# Patient Record
Sex: Male | Born: 1985 | Race: Black or African American | Hispanic: No | Marital: Single | Smoking: Never smoker
Health system: Southern US, Community
[De-identification: ages and names within clinical notes are randomized; demographics above are authoritative.]

## PROBLEM LIST (undated history)

## (undated) DIAGNOSIS — E119 Type 2 diabetes mellitus without complications: Secondary | ICD-10-CM

## (undated) DIAGNOSIS — A4902 Methicillin resistant Staphylococcus aureus infection, unspecified site: Secondary | ICD-10-CM

## (undated) DIAGNOSIS — K859 Acute pancreatitis without necrosis or infection, unspecified: Secondary | ICD-10-CM

---

## 2010-06-24 ENCOUNTER — Emergency Department (HOSPITAL_BASED_OUTPATIENT_CLINIC_OR_DEPARTMENT_OTHER)
Admission: EM | Admit: 2010-06-24 | Discharge: 2010-06-24 | Disposition: A | Discharge status: Home / Self Care | Payer: Self-pay | Attending: Emergency Medicine | Admitting: Emergency Medicine

## 2010-06-24 DIAGNOSIS — R509 Fever, unspecified: Secondary | ICD-10-CM | POA: Insufficient documentation

## 2010-06-24 LAB — URINE MICROSCOPIC-ADD ON

## 2010-06-24 LAB — URINALYSIS, ROUTINE W REFLEX MICROSCOPIC
Glucose, UA: NEGATIVE mg/dL
Hgb urine dipstick: NEGATIVE
Ketones, ur: 15 mg/dL — AB
Protein, ur: 30 mg/dL — AB
Urobilinogen, UA: 0.2 mg/dL (ref 0.0–1.0)

## 2010-12-23 ENCOUNTER — Emergency Department (HOSPITAL_BASED_OUTPATIENT_CLINIC_OR_DEPARTMENT_OTHER)
Admission: EM | Admit: 2010-12-23 | Discharge: 2010-12-23 | Disposition: A | Discharge status: Home / Self Care | Payer: Self-pay | Attending: Emergency Medicine | Admitting: Emergency Medicine

## 2010-12-23 ENCOUNTER — Encounter: Payer: Self-pay | Admitting: *Deleted

## 2010-12-23 DIAGNOSIS — R197 Diarrhea, unspecified: Secondary | ICD-10-CM | POA: Insufficient documentation

## 2010-12-23 DIAGNOSIS — A088 Other specified intestinal infections: Secondary | ICD-10-CM | POA: Insufficient documentation

## 2010-12-23 DIAGNOSIS — A084 Viral intestinal infection, unspecified: Secondary | ICD-10-CM

## 2010-12-23 MED ORDER — DIPHENOXYLATE-ATROPINE 2.5-0.025 MG PO TABS: 1.0000 | ORAL_TABLET | Freq: Four times a day (QID) | ORAL | Status: AC | PRN

## 2010-12-23 NOTE — ED Notes (Signed)
Pt states he had diarrhea which started Wed "a little today", but basically here to make sure he is over it and can return to work.

## 2010-12-23 NOTE — ED Provider Notes (Signed)
History    Scribed for Felisa Bonier, MD, the patient was seen in room MH02/MH02. This chart was scribed by Katha Cabal.   CSN: [id number] Arrival date & time: 12/23/2010  7:41 PM   First MD Initiated Contact with Patient 12/23/10 2028      Chief Complaint  Patient presents with  . Diarrhea    (Consider location/radiation/quality/duration/timing/severity/associated sxs/prior treatment) Patient is a 25 y.o. male presenting with diarrhea. The history is provided by the patient. No language interpreter was used.  Diarrhea The primary symptoms include abdominal pain and diarrhea. Primary symptoms do not include fever or vomiting. The illness began 3 to 5 days ago. The onset was gradual. The problem has been gradually improving.  The abdominal pain is generalized.  The diarrhea is blood-tinged.   Patient states that he had a stomach virus about 5 days ago.  Reports improvement in diarrhea and abdominal pain.    History reviewed. No pertinent past medical history.  History reviewed. No pertinent past surgical history.  History reviewed. No pertinent family history.  History  Substance Use Topics  . Smoking status: Never Smoker   . Smokeless tobacco: Not on file  . Alcohol Use: No      Review of Systems  Constitutional: Negative for fever.  Gastrointestinal: Positive for abdominal pain and diarrhea. Negative for vomiting.  All other systems reviewed and are negative.    Allergies  Review of patient's allergies indicates no known allergies.  Home Medications  No current outpatient prescriptions on file.  BP 117/77  Pulse 71  Temp(Src) 97.9 F (36.6 C) (Oral)  Resp 18  Ht 5\' 5"  (1.651 m)  Wt 170 lb (77.111 kg)  BMI 28.29 kg/m2  SpO2 100%  Physical Exam  Constitutional: He is oriented to person, place, and time. He appears well-developed and well-nourished. No distress.  HENT:  Head: Normocephalic and atraumatic.  Mouth/Throat: Oropharynx is clear and  moist.  Eyes: Conjunctivae and EOM are normal.  Cardiovascular: Normal rate, regular rhythm and normal heart sounds.  Exam reveals no gallop and no friction rub.   No murmur heard. Pulmonary/Chest: Effort normal and breath sounds normal. No respiratory distress. He has no wheezes. He has no rales.  Abdominal: Soft. Bowel sounds are normal. There is no tenderness. There is no rebound and no guarding.  Neurological: He is alert and oriented to person, place, and time.  Skin: Skin is warm and dry.       Good capillary refill   Psychiatric: He has a normal mood and affect. His behavior is normal.    ED Course  Procedures (including critical care time)   DIAGNOSTIC STUDIES: Oxygen Saturation is 100% on room air, normal by my interpretation.     COORDINATION OF CARE: 9:17 PM   Physical exam complete.  Plan to discharge patient home.  Patient agrees with plan.      LABS / RADIOLOGY:   Labs Reviewed - No data to display No results found.       MDM   Resolving viral gastroenteritis.  I will treat remaining diarrhea with medication and allow patient to return to work.       MEDICATIONS GIVEN IN THE E.D. Scheduled Meds:   Continuous Infusions:       IMPRESSION: No diagnosis found.   DISCHARGE MEDICATIONS: New Prescriptions   No medications on file      I personally performed the services described in this documentation, which was scribed in my presence. The recorded  information has been reviewed and considered.             Felisa Bonier, MD 12/23/10 2127

## 2010-12-23 NOTE — ED Notes (Signed)
D/c home with rx x 1 for lomotil

## 2011-05-27 ENCOUNTER — Emergency Department (HOSPITAL_BASED_OUTPATIENT_CLINIC_OR_DEPARTMENT_OTHER)
Admission: EM | Admit: 2011-05-27 | Discharge: 2011-05-27 | Disposition: A | Discharge status: Home / Self Care | Payer: BC Managed Care – PPO | Attending: Emergency Medicine | Admitting: Emergency Medicine

## 2011-05-27 ENCOUNTER — Encounter (HOSPITAL_BASED_OUTPATIENT_CLINIC_OR_DEPARTMENT_OTHER): Payer: Self-pay | Admitting: *Deleted

## 2011-05-27 DIAGNOSIS — S239XXA Sprain of unspecified parts of thorax, initial encounter: Secondary | ICD-10-CM | POA: Insufficient documentation

## 2011-05-27 DIAGNOSIS — X58XXXA Exposure to other specified factors, initial encounter: Secondary | ICD-10-CM | POA: Insufficient documentation

## 2011-05-27 DIAGNOSIS — S29012A Strain of muscle and tendon of back wall of thorax, initial encounter: Secondary | ICD-10-CM

## 2011-05-27 MED ORDER — CYCLOBENZAPRINE HCL 10 MG PO TABS
10.0000 mg | ORAL_TABLET | Freq: Once | ORAL | Status: AC
  Administered 2011-05-27: 10 mg via ORAL
  Filled 2011-05-27: qty 1

## 2011-05-27 MED ORDER — TRAMADOL HCL 50 MG PO TABS
50.0000 mg | ORAL_TABLET | Freq: Once | ORAL | Status: AC
  Administered 2011-05-27: 50 mg via ORAL
  Filled 2011-05-27: qty 1

## 2011-05-27 MED ORDER — CYCLOBENZAPRINE HCL 10 MG PO TABS: 10.0000 mg | ORAL_TABLET | Freq: Three times a day (TID) | ORAL | Status: AC | PRN

## 2011-05-27 MED ORDER — TRAMADOL HCL 50 MG PO TABS: 50.0000 mg | ORAL_TABLET | Freq: Four times a day (QID) | ORAL | Status: AC | PRN

## 2011-05-27 MED ORDER — IBUPROFEN 800 MG PO TABS
800.0000 mg | ORAL_TABLET | Freq: Once | ORAL | Status: AC
  Administered 2011-05-27: 800 mg via ORAL
  Filled 2011-05-27: qty 1

## 2011-05-27 MED ORDER — NAPROXEN 500 MG PO TABS: 500.0000 mg | ORAL_TABLET | Freq: Two times a day (BID) | ORAL | Status: DC

## 2011-05-27 NOTE — ED Notes (Signed)
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

## 2011-05-27 NOTE — ED Provider Notes (Signed)
History    This chart was scribed for Dayton Bailiff, MD, MD by Smitty Pluck. The patient was seen in room MH03 and the patient's care was started at 8:34PM.   CSN: [id number]  Arrival date & time 05/27/11  2025   First MD Initiated Contact with Patient 05/27/11 2032      No chief complaint on file.   (Consider location/radiation/quality/duration/timing/severity/associated sxs/prior treatment) The history is provided by the patient.   Patrick Donovan is a 26 y.o. male who presents to the Emergency Department complaining of moderate back pain. Reports that he has had the pain for awhile but has tried to ignore it. 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 The pain has been constant since onset without radiation. He has not taken medication for pain PTA. Denies incontinence.  Denies any other pain.   History reviewed. No pertinent past medical history.  History reviewed. No pertinent past surgical history.  No family history on file.  History  Substance Use Topics  . Smoking status: Never Smoker   . Smokeless tobacco: Not on file  . Alcohol Use: No      Review of Systems  All other systems reviewed and are negative.   10 Systems reviewed and all are negative for acute change except as noted in the HPI.   Allergies  Review of patient's allergies indicates no known allergies.  Home Medications   Current Outpatient Rx  Name Route Sig Dispense Refill  . CYCLOBENZAPRINE HCL 10 MG PO TABS Oral Take 1 tablet (10 mg total) by mouth 3 (three) times daily as needed for muscle spasms. 30 tablet 0  . NAPROXEN 500 MG PO TABS Oral Take 1 tablet (500 mg total) by mouth 2 (two) times daily. 30 tablet 0  . TRAMADOL HCL 50 MG PO TABS Oral Take 1 tablet (50 mg total) by mouth every 6 (six) hours as needed for pain. 15 tablet 0    BP 131/98  Pulse 87  Temp(Src) 98 F (36.7 C) (Oral)  Resp 20  Wt 180 lb (81.647 kg)  SpO2 99%  Physical Exam  Nursing note  and vitals reviewed. Constitutional: He is oriented to person, place, and time. He appears well-developed and well-nourished. No distress.  HENT:  Head: Normocephalic and atraumatic.  Neck: Normal range of motion. Neck supple.  Cardiovascular: Normal rate, regular rhythm and normal heart sounds.   Pulmonary/Chest: Effort normal and breath sounds normal. No respiratory distress.  Abdominal: Soft. He exhibits no distension. There is no tenderness.  Musculoskeletal:       No pain in cervical spine to sacrum in the midline Pain in paraspinal and lateral musculature with palpation   Neurological: He is alert and oriented to person, place, and time. He has normal strength and normal reflexes. No cranial nerve deficit or sensory deficit.  Skin: Skin is warm and dry.  Psychiatric: He has a normal mood and affect. His behavior is normal.    ED Course  Procedures (including critical care time)  DIAGNOSTIC STUDIES: Oxygen Saturation is 99% on room air, normal by my interpretation.    COORDINATION OF CARE: 8:35PM EDP discusses pt ED treatment with pt.  8:38PM EDP orders medication: ibuprofen 800 mg, ultram 50 mg, flexeril 10 mg  Labs Reviewed - No data to display No results found.   1. Strain of mid-back       MDM  Consistent with a strain. No concern about a malignant cause of back  pain such as cauda equina or epidural abscess. No neurologic symptoms. No indication for imaging as he has no midline tenderness. His pain is strictly muscular. Instructed to refrain from driving while taking Ultram and Flexeril. Instructed to apply ice for 2 days and heat thereafter.  I personally performed the services described in this documentation, which was scribed in my presence. The recorded information has been reviewed and considered.        Dayton Bailiff, MD 05/27/11 2048

## 2011-05-27 NOTE — Discharge Instructions (Signed)
Back Pain, Adult Low back pain is very common. About 1 in 5 people have back pain.The cause of low back pain is rarely dangerous. The pain often gets better over time.About half of people with a sudden onset of back pain feel better in just 2 weeks. About 8 in 10 people feel better by 6 weeks.  CAUSES Some common causes of back pain include:  Strain of the muscles or ligaments supporting the spine.   Wear and tear (degeneration) of the spinal discs.   Arthritis.   Direct injury to the back.  DIAGNOSIS Most of the time, the direct cause of low back pain is not known.However, back pain can be treated effectively even when the exact cause of the pain is unknown.Answering your caregiver's questions about your overall health and symptoms is one of the most accurate ways to make sure the cause of your pain is not dangerous. If your caregiver needs more information, he or she may order lab work or imaging tests (X-rays or MRIs).However, even if imaging tests show changes in your back, this usually does not require surgery. HOME CARE INSTRUCTIONS For many people, back pain returns.Since low back pain is rarely dangerous, it is often a condition that people can learn to manageon their own.   Remain active. It is stressful on the back to sit or stand in one place. Do not sit, drive, or stand in one place for more than 30 minutes at a time. Take short walks on level surfaces as soon as pain allows.Try to increase the length of time you walk each day.   Do not stay in bed.Resting more than 1 or 2 days can delay your recovery.   Do not avoid exercise or work.Your body is made to move.It is not dangerous to be active, even though your back may hurt.Your back will likely heal faster if you return to being active before your pain is gone.   Pay attention to your body when you bend and lift. Many people have less discomfortwhen lifting if they bend their knees, keep the load close to their  bodies,and avoid twisting. Often, the most comfortable positions are those that put less stress on your recovering back.   Find a comfortable position to sleep. Use a firm mattress and lie on your side with your knees slightly bent. If you lie on your back, put a pillow under your knees.   Only take over-the-counter or prescription medicines as directed by your caregiver. Over-the-counter medicines to reduce pain and inflammation are often the most helpful.Your caregiver may prescribe muscle relaxant drugs.These medicines help dull your pain so you can more quickly return to your normal activities and healthy exercise.   Put ice on the injured area.   Put ice in a plastic bag.   Place a towel between your skin and the bag.   Leave the ice on for 15 to 20 minutes, 3 to 4 times a day for the first 2 to 3 days. After that, ice and heat may be alternated to reduce pain and spasms.   Ask your caregiver about trying back exercises and gentle massage. This may be of some benefit.   Avoid feeling anxious or stressed.Stress increases muscle tension and can worsen back pain.It is important to recognize when you are anxious or stressed and learn ways to manage it.Exercise is a great option.  SEEK MEDICAL CARE IF:  You have pain that is not relieved with rest or medicine.   You have   pain that does not improve in 1 week.   You have new symptoms.   You are generally not feeling well.  SEEK IMMEDIATE MEDICAL CARE IF:   You have pain that radiates from your back into your legs.   You develop new bowel or bladder control problems.   You have unusual weakness or numbness in your arms or legs.   You develop nausea or vomiting.   You develop abdominal pain.   You feel faint.  Document Released: 12/31/2004 Document Revised: 12/20/2010 Document Reviewed: 05/21/2010 [facility] Patient Information 2012 [place], [place].  Do not drive while taking Ultram and Flexeril

## 2012-04-09 ENCOUNTER — Emergency Department (HOSPITAL_BASED_OUTPATIENT_CLINIC_OR_DEPARTMENT_OTHER)
Admission: EM | Admit: 2012-04-09 | Discharge: 2012-04-09 | Disposition: A | Discharge status: Home / Self Care | Payer: BC Managed Care – PPO | Attending: Emergency Medicine | Admitting: Emergency Medicine

## 2012-04-09 ENCOUNTER — Emergency Department (HOSPITAL_BASED_OUTPATIENT_CLINIC_OR_DEPARTMENT_OTHER): Payer: BC Managed Care – PPO

## 2012-04-09 ENCOUNTER — Encounter (HOSPITAL_BASED_OUTPATIENT_CLINIC_OR_DEPARTMENT_OTHER): Payer: Self-pay

## 2012-04-09 DIAGNOSIS — N201 Calculus of ureter: Secondary | ICD-10-CM | POA: Insufficient documentation

## 2012-04-09 DIAGNOSIS — R1110 Vomiting, unspecified: Secondary | ICD-10-CM | POA: Insufficient documentation

## 2012-04-09 LAB — CBC WITH DIFFERENTIAL/PLATELET
Basophils Absolute: 0 10*3/uL (ref 0.0–0.1)
Eosinophils Relative: 0 % (ref 0–5)
HCT: 44.9 % (ref 39.0–52.0)
Hemoglobin: 15.7 g/dL (ref 13.0–17.0)
Lymphocytes Relative: 18 % (ref 12–46)
Lymphs Abs: 1.5 10*3/uL (ref 0.7–4.0)
MCV: 88.7 fL (ref 78.0–100.0)
Monocytes Absolute: 0.9 10*3/uL (ref 0.1–1.0)
Monocytes Relative: 10 % (ref 3–12)
Neutro Abs: 6.1 10*3/uL (ref 1.7–7.7)
WBC: 8.5 10*3/uL (ref 4.0–10.5)

## 2012-04-09 LAB — COMPREHENSIVE METABOLIC PANEL
AST: 25 U/L (ref 0–37)
BUN: 13 mg/dL (ref 6–23)
CO2: 28 mEq/L (ref 19–32)
Chloride: 97 mEq/L (ref 96–112)
Creatinine, Ser: 1.9 mg/dL — ABNORMAL HIGH (ref 0.50–1.35)
GFR calc Af Amer: 55 mL/min — ABNORMAL LOW (ref >90)
GFR calc non Af Amer: 48 mL/min — ABNORMAL LOW (ref >90)
Glucose, Bld: 124 mg/dL — ABNORMAL HIGH (ref 70–99)
Total Bilirubin: 1.5 mg/dL — ABNORMAL HIGH (ref 0.3–1.2)

## 2012-04-09 LAB — URINALYSIS, ROUTINE W REFLEX MICROSCOPIC
Glucose, UA: NEGATIVE mg/dL
Ketones, ur: 15 mg/dL — AB
Leukocytes, UA: NEGATIVE
Protein, ur: NEGATIVE mg/dL
Urobilinogen, UA: 0.2 mg/dL (ref 0.0–1.0)

## 2012-04-09 MED ORDER — IOHEXOL 300 MG/ML  SOLN
50.0000 mL | Freq: Once | INTRAMUSCULAR | Status: AC | PRN
  Administered 2012-04-09: 50 mL via ORAL

## 2012-04-09 MED ORDER — TAMSULOSIN HCL 0.4 MG PO CAPS: 0.4000 mg | ORAL_CAPSULE | Freq: Every day | ORAL | Status: DC

## 2012-04-09 MED ORDER — ONDANSETRON HCL 4 MG/2ML IJ SOLN
4.0000 mg | Freq: Once | INTRAMUSCULAR | Status: AC
Start: 2012-04-09 — End: 2012-04-09
  Administered 2012-04-09: 4 mg via INTRAVENOUS
  Filled 2012-04-09: qty 2

## 2012-04-09 MED ORDER — IBUPROFEN 800 MG PO TABS: 800.0000 mg | ORAL_TABLET | Freq: Three times a day (TID) | ORAL | Status: DC | PRN

## 2012-04-09 MED ORDER — SODIUM CHLORIDE 0.9 % IV BOLUS (SEPSIS)
1000.0000 mL | Freq: Once | INTRAVENOUS | Status: AC
  Administered 2012-04-09: 1000 mL via INTRAVENOUS

## 2012-04-09 MED ORDER — ONDANSETRON 8 MG PO TBDP
8.0000 mg | ORAL_TABLET | Freq: Once | ORAL | Status: AC
  Administered 2012-04-09: 8 mg via ORAL
  Filled 2012-04-09: qty 1

## 2012-04-09 MED ORDER — OXYCODONE-ACETAMINOPHEN 5-325 MG PO TABS: 1.0000 | ORAL_TABLET | Freq: Four times a day (QID) | ORAL | Status: DC | PRN

## 2012-04-09 MED ORDER — HYDROMORPHONE HCL PF 1 MG/ML IJ SOLN
1.0000 mg | Freq: Once | INTRAMUSCULAR | Status: AC
  Administered 2012-04-09: 1 mg via INTRAVENOUS
  Filled 2012-04-09: qty 1

## 2012-04-09 NOTE — ED Notes (Signed)
MD at bedside. 

## 2012-04-09 NOTE — ED Notes (Signed)
Patient vomiting. MD notified, Zofran prescribed and administered.

## 2012-04-09 NOTE — ED Notes (Signed)
Pt drinking PO contrast

## 2012-04-09 NOTE — ED Notes (Signed)
Pt reports onset of abdominal cramping that started Monday associated with vomiting x 2.

## 2012-04-09 NOTE — ED Provider Notes (Signed)
History     CSN: [id number]  Arrival date & time 04/09/12  1017   First MD Initiated Contact with Patient 04/09/12 1019      Chief Complaint  Patient presents with  . Abdominal Cramping  . Emesis    (Consider location/radiation/quality/duration/timing/severity/associated sxs/prior treatment) Patient is a 27 y.o. male presenting with cramps and vomiting.  Abdominal Cramping  Emesis  Pt reports 4 days of moderate cramping L lower abdominal pain, comes and goes, No particular provoking or relieving factors. Associated with vomiting x 2 over the last 4 days, last was this AM, subjective fever. No diarrhea or constipation. No dysuria. He has tried Aleve, pepto-bismal and TUMS without relief.   History reviewed. No pertinent past medical history.  History reviewed. No pertinent past surgical history.  No family history on file.  History  Substance Use Topics  . Smoking status: Never Smoker   . Smokeless tobacco: Not on file  . Alcohol Use: No      Review of Systems  Gastrointestinal: Positive for vomiting.   All other systems reviewed and are negative except as noted in HPI.   Allergies  Review of patient's allergies indicates no known allergies.  Home Medications  No current outpatient prescriptions on file.  BP 144/82  Pulse 83  Temp(Src) 98.9 F (37.2 C) (Oral)  Resp 16  Ht 5\' 5"  (1.651 m)  Wt 185 lb (83.915 kg)  BMI 30.79 kg/m2  SpO2 98%  Physical Exam  Nursing note and vitals reviewed. Constitutional: He is oriented to person, place, and time. He appears well-developed and well-nourished.  HENT:  Head: Normocephalic and atraumatic.  Eyes: EOM are normal. Pupils are equal, round, and reactive to light.  Neck: Normal range of motion. Neck supple.  Cardiovascular: Normal rate, normal heart sounds and intact distal pulses.   Pulmonary/Chest: Effort normal and breath sounds normal.  Abdominal: Bowel sounds are normal. He exhibits no distension. There is  no tenderness. There is no rebound and no guarding.  Musculoskeletal: Normal range of motion. He exhibits no edema and no tenderness.  Neurological: He is alert and oriented to person, place, and time. He has normal strength. No cranial nerve deficit or sensory deficit.  Skin: Skin is warm and dry. No rash noted.  Psychiatric: He has a normal mood and affect.    ED Course  Procedures (including critical care time)  Labs Reviewed  COMPREHENSIVE METABOLIC PANEL - Abnormal; Notable for the following:    Glucose, Bld 124 (*)    Creatinine, Ser 1.90 (*)    Total Bilirubin 1.5 (*)    GFR calc non Af Amer 48 (*)    GFR calc Af Amer 55 (*)    All other components within normal limits  URINALYSIS, ROUTINE W REFLEX MICROSCOPIC - Abnormal; Notable for the following:    Ketones, ur 15 (*)    All other components within normal limits  CBC WITH DIFFERENTIAL  LIPASE, BLOOD   Ct Abdomen Pelvis Wo Contrast  04/09/2012  *RADIOLOGY REPORT*  Clinical Data: Abdominal pain.  Vomiting.  CT ABDOMEN AND PELVIS WITHOUT CONTRAST  Technique:  Multidetector CT imaging of the abdomen and pelvis was performed following the standard protocol without intravenous contrast.  Comparison: None.  Findings: Mild dependent atelectasis is seen in the lung bases.  No pleural or pericardial effusion.  The patient has mild left hydronephrosis due to a proximal left ureteral stone measuring approximately 0.2 cm.  No other urinary tract stones are seen on the  right or the left.  Urinary bladder, prostate gland and seminal vesicles are unremarkable.  The liver is diffusely and markedly low attenuating consistent with fatty infiltration.  No focal liver lesion.  The gallbladder, spleen, adrenal glands and pancreas appear normal.  The stomach, small and large bowel and appendix appear normal.  There is no lymphadenopathy or fluid.  No focal bony abnormality is identified.  IMPRESSION:  1.  Mild left hydronephrosis due to a punctate  proximal left ureteral stone. 2.  Diffuse fatty infiltration of the liver.   Original Report Authenticated By: Holley Dexter, M.D.      No diagnosis found.    MDM  Abdomen benign, no focal tenderness, vitals unremarkable. Will check labs, hold off on imaging for now.   2:15 PM Labs reviewed, creatinine elevated but unknown baseline. Pain has returned and now tender in the LLQ. Will send for CT, PO contrast only.   2:41 PM CT reviewed, shows small proximal ureteral stone. Pt advised of the elevated creatinine and need for followup if symptoms persist. Pain meds, Flomax and Urology referral.     Bonnita Levan. Bernette Mayers, MD 04/09/12 [phone]

## 2013-08-10 ENCOUNTER — Emergency Department (HOSPITAL_BASED_OUTPATIENT_CLINIC_OR_DEPARTMENT_OTHER)
Admission: EM | Admit: 2013-08-10 | Discharge: 2013-08-11 | Disposition: A | Discharge status: Home / Self Care | Payer: No Typology Code available for payment source | Attending: Emergency Medicine | Admitting: Emergency Medicine

## 2013-08-10 ENCOUNTER — Encounter (HOSPITAL_BASED_OUTPATIENT_CLINIC_OR_DEPARTMENT_OTHER): Payer: Self-pay | Admitting: Emergency Medicine

## 2013-08-10 DIAGNOSIS — IMO0002 Reserved for concepts with insufficient information to code with codable children: Secondary | ICD-10-CM

## 2013-08-10 DIAGNOSIS — L02419 Cutaneous abscess of limb, unspecified: Secondary | ICD-10-CM | POA: Insufficient documentation

## 2013-08-10 DIAGNOSIS — Z79899 Other long term (current) drug therapy: Secondary | ICD-10-CM | POA: Insufficient documentation

## 2013-08-10 DIAGNOSIS — L03119 Cellulitis of unspecified part of limb: Principal | ICD-10-CM

## 2013-08-10 DIAGNOSIS — R21 Rash and other nonspecific skin eruption: Secondary | ICD-10-CM | POA: Insufficient documentation

## 2013-08-10 DIAGNOSIS — Z8614 Personal history of Methicillin resistant Staphylococcus aureus infection: Secondary | ICD-10-CM | POA: Insufficient documentation

## 2013-08-10 NOTE — ED Notes (Signed)
Pt reports ? Insect bite with redness and swelling to posterior lower thigh area.

## 2013-08-11 MED ORDER — SULFAMETHOXAZOLE-TMP DS 800-160 MG PO TABS: 1.0000 | ORAL_TABLET | Freq: Two times a day (BID) | ORAL | Status: DC

## 2013-08-11 NOTE — ED Provider Notes (Signed)
CSN: [id number]     Arrival date & time 08/10/13  2239 History   First MD Initiated Contact with Patient 08/10/13 2303     Chief Complaint  Patient presents with  . Insect Bite     (Consider location/radiation/quality/duration/timing/severity/associated sxs/prior Treatment) HPI Comments: 28 year-old male with history of MRSA abscess, no other significant echo history presents with swelling and redness to posterior left thigh. Similar previous abscess history. No fevers or systemic symptoms. Patient is nondiabetic and no current antibiotics  The history is provided by the patient.    History reviewed. No pertinent past medical history. History reviewed. No pertinent past surgical history. No family history on file. History  Substance Use Topics  . Smoking status: Never Smoker   . Smokeless tobacco: Not on file  . Alcohol Use: No    Review of Systems  Constitutional: Negative for fever and chills.  Respiratory: Negative for shortness of breath.   Cardiovascular: Negative for chest pain.  Gastrointestinal: Negative for vomiting and abdominal pain.  Genitourinary: Negative for dysuria and flank pain.  Musculoskeletal: Negative for neck pain and neck stiffness.  Skin: Positive for rash.  Neurological: Negative for light-headedness and headaches.      Allergies  Review of patient's allergies indicates no known allergies.  Home Medications   Prior to Admission medications   Medication Sig Start Date End Date Taking? Authorizing Provider  ibuprofen (ADVIL,MOTRIN) 800 MG tablet Take 1 tablet (800 mg total) by mouth every 8 (eight) hours as needed for pain. 04/09/12   Charles B. Bernette Mayers, MD  oxyCODONE-acetaminophen (PERCOCET/ROXICET) 5-325 MG per tablet Take 1-2 tablets by mouth every 6 (six) hours as needed for pain. 04/09/12   Charles B. Bernette Mayers, MD  sulfamethoxazole-trimethoprim (BACTRIM DS) 800-160 MG per tablet Take 1 tablet by mouth 2 (two) times daily. 08/11/13   Enid Skeens, MD  tamsulosin (FLOMAX) 0.4 MG CAPS Take 1 capsule (0.4 mg total) by mouth daily. 04/09/12   Charles B. Bernette Mayers, MD   BP 143/92  Pulse 94  Temp(Src) 97.9 F (36.6 C) (Oral)  Resp 16  Ht 5\' 5"  (1.651 m)  Wt 181 lb 6 oz (82.271 kg)  BMI 30.18 kg/m2  SpO2 100% Physical Exam  Nursing note and vitals reviewed. Constitutional: He is oriented to person, place, and time. He appears well-developed and well-nourished.  HENT:  Head: Normocephalic and atraumatic.  Eyes: Conjunctivae are normal. Right eye exhibits no discharge. Left eye exhibits no discharge.  Neck: Normal range of motion. Neck supple. No tracheal deviation present.  Cardiovascular: Normal rate.   Pulmonary/Chest: Effort normal.  Musculoskeletal: He exhibits no edema.  Neurological: He is alert and oriented to person, place, and time.  Skin: Skin is warm. Rash noted.  Patient has 2 cm diameter fluid collection/abscess left posterior thigh with mild warmth and erythema surrounding. No streaking redness.  Psychiatric: He has a normal mood and affect.    ED Course  Procedures (including critical care time) EMERGENCY DEPARTMENT US SOFT TISSUE INTERPRETATION "Study: Limited Soft Tissue Ultrasound"  INDICATIONS: Pain and Soft tissue infection Multiple views of the body part were obtained in real-time with a multi-frequency linear probe PERFORMED BY:  Myself IMAGES ARCHIVED?: Yes SIDE:Left BODY PART:Lower extremity FINDINGS: Abcess present INTERPRETATION:  Abcess present  INCISION AND DRAINAGE Performed by: Enid Skeens Consent: Verbal consent obtained. Risks and benefits: risks, benefits and alternatives were discussed Type: abscess  Body area: left thigh posterior Anesthesia: local infiltration Incision was made with a scalpel.  Local anesthetic: lidocaine Anesthetic total: 1 ml Complexity: simple Blunt dissection to break up loculations Drainage: 2 cc drainage  Patient tolerance: Patient tolerated  the procedure well with no immediate complications.    Labs Review Labs Reviewed - No data to display  Imaging Review No results found.   EKG Interpretation None      MDM   Final diagnoses:  Abscess or cellulitis of thigh    Patient with thigh abscess/ blister similar to previous. Discussed oral antibiotics without surrounding redness and followup outpatient. Supportive care discussed. I&D in ER. US in ED.   Results and differential diagnosis were discussed with the patient/parent/guardian. Close follow up outpatient was discussed, comfortable with the plan.   Medications - No data to display  Filed Vitals:   08/10/13 2248  BP: 143/92  Pulse: 94  Temp: 97.9 F (36.6 C)  TempSrc: Oral  Resp: 16  Height: 5\' 5"  (1.651 m)  Weight: 181 lb 6 oz (82.271 kg)  SpO2: 100%        Enid SkeensJoshua M Toluwani Ruder, MD 08/11/13 0131

## 2013-08-11 NOTE — Discharge Instructions (Signed)
If you were given medicines take as directed.  If you are on coumadin or contraceptives realize their levels and effectiveness is altered by many different medicines.  If you have any reaction (rash, tongues swelling, other) to the medicines stop taking and see a physician.   Wash body once or twice with chlorhexidine after abscess clears. Please follow up as directed and return to the ER or see a physician for new or worsening symptoms.  Thank you. Filed Vitals:   08/10/13 2248  BP: 143/92  Pulse: 94  Temp: 97.9 F (36.6 C)  TempSrc: Oral  Resp: 16  Height: 5\' 5"  (1.651 m)  Weight: 181 lb 6 oz (82.271 kg)  SpO2: 100%    Abscess An abscess is an infected area that contains a collection of pus and debris.It can occur in almost any part of the body. An abscess is also known as a furuncle or boil. CAUSES  An abscess occurs when tissue gets infected. This can occur from blockage of oil or sweat glands, infection of hair follicles, or a minor injury to the skin. As the body tries to fight the infection, pus collects in the area and creates pressure under the skin. This pressure causes pain. People with weakened immune systems have difficulty fighting infections and get certain abscesses more often.  SYMPTOMS Usually an abscess develops on the skin and becomes a painful mass that is red, warm, and tender. If the abscess forms under the skin, you may feel a moveable soft area under the skin. Some abscesses break open (rupture) on their own, but most will continue to get worse without care. The infection can spread deeper into the body and eventually into the bloodstream, causing you to feel ill.  DIAGNOSIS  Your caregiver will take your medical history and perform a physical exam. A sample of fluid may also be taken from the abscess to determine what is causing your infection. TREATMENT  Your caregiver may prescribe antibiotic medicines to fight the infection. However, taking antibiotics alone  usually does not cure an abscess. Your caregiver may need to make a small cut (incision) in the abscess to drain the pus. In some cases, gauze is packed into the abscess to reduce pain and to continue draining the area. HOME CARE INSTRUCTIONS   Only take over-the-counter or prescription medicines for pain, discomfort, or fever as directed by your caregiver.  If you were prescribed antibiotics, take them as directed. Finish them even if you start to feel better.  If gauze is used, follow your caregiver's directions for changing the gauze.  To avoid spreading the infection:  Keep your draining abscess covered with a bandage.  Wash your hands well.  Do not share personal care items, towels, or whirlpools with others.  Avoid skin contact with others.  Keep your skin and clothes clean around the abscess.  Keep all follow-up appointments as directed by your caregiver. SEEK MEDICAL CARE IF:   You have increased pain, swelling, redness, fluid drainage, or bleeding.  You have muscle aches, chills, or a general ill feeling.  You have a fever. MAKE SURE YOU:   Understand these instructions.  Will watch your condition.  Will get help right away if you are not doing well or get worse. Document Released: 10/10/2004 Document Revised: 07/02/2011 Document Reviewed: 03/15/2011 [facility] Patient Information 2015 [place]ExitCare, [place]. This information is not intended to replace advice given to you by your health care provider. Make sure you discuss any questions you have with  your health care provider. ° °

## 2013-08-13 ENCOUNTER — Emergency Department (HOSPITAL_BASED_OUTPATIENT_CLINIC_OR_DEPARTMENT_OTHER)
Admission: EM | Admit: 2013-08-13 | Discharge: 2013-08-13 | Disposition: A | Discharge status: Home / Self Care | Payer: No Typology Code available for payment source | Attending: Emergency Medicine | Admitting: Emergency Medicine

## 2013-08-13 ENCOUNTER — Encounter (HOSPITAL_BASED_OUTPATIENT_CLINIC_OR_DEPARTMENT_OTHER): Payer: Self-pay | Admitting: Emergency Medicine

## 2013-08-13 DIAGNOSIS — Z8614 Personal history of Methicillin resistant Staphylococcus aureus infection: Secondary | ICD-10-CM | POA: Insufficient documentation

## 2013-08-13 DIAGNOSIS — Z4801 Encounter for change or removal of surgical wound dressing: Secondary | ICD-10-CM | POA: Insufficient documentation

## 2013-08-13 DIAGNOSIS — Z791 Long term (current) use of non-steroidal anti-inflammatories (NSAID): Secondary | ICD-10-CM | POA: Insufficient documentation

## 2013-08-13 DIAGNOSIS — L03119 Cellulitis of unspecified part of limb: Principal | ICD-10-CM

## 2013-08-13 DIAGNOSIS — L02419 Cutaneous abscess of limb, unspecified: Secondary | ICD-10-CM | POA: Insufficient documentation

## 2013-08-13 DIAGNOSIS — Z79899 Other long term (current) drug therapy: Secondary | ICD-10-CM | POA: Insufficient documentation

## 2013-08-13 DIAGNOSIS — Z792 Long term (current) use of antibiotics: Secondary | ICD-10-CM | POA: Insufficient documentation

## 2013-08-13 HISTORY — DX: Methicillin resistant Staphylococcus aureus infection, unspecified site: A49.02

## 2013-08-13 MED ORDER — CLINDAMYCIN HCL 300 MG PO CAPS: 300.0000 mg | ORAL_CAPSULE | Freq: Three times a day (TID) | ORAL | Status: DC

## 2013-08-13 MED ORDER — OXYCODONE-ACETAMINOPHEN 5-325 MG PO TABS
1.0000 | ORAL_TABLET | Freq: Once | ORAL | Status: AC
  Administered 2013-08-13: 1 via ORAL
  Filled 2013-08-13: qty 1

## 2013-08-13 MED ORDER — OXYCODONE-ACETAMINOPHEN 5-325 MG PO TABS: 1.0000 | ORAL_TABLET | Freq: Four times a day (QID) | ORAL | Status: DC | PRN

## 2013-08-13 MED ORDER — CLINDAMYCIN HCL 150 MG PO CAPS
300.0000 mg | ORAL_CAPSULE | Freq: Once | ORAL | Status: AC
  Administered 2013-08-13: 300 mg via ORAL
  Filled 2013-08-13: qty 2

## 2013-08-13 NOTE — ED Provider Notes (Signed)
CSN: [id number]     Arrival date & time 08/13/13  2117 History  This chart was scribed for No att. providers found by Carl Best, ED Scribe. This patient was seen in room MHOTF/OTF and the patient's care was started at 10:23 PM.     Chief Complaint  Patient presents with  . Wound Check   Patient is a 28 y.o. male presenting with wound check. The history is provided by the patient. No language interpreter was used.  Wound Check   HPI Comments: Patrick Donovan is a 28 y.o. male who presents to the Emergency Department for wound care of an abscess that was drained 3 days ago.  He has been taking Bactrim as prescribed.  He states that the area has become darker around the I&D site and is still tender.  He denies vomiting and fever as associated symptoms.  He does not have any allergies to medication.  He does not have a PCP.  States pain is constant and that he has not been taking anything for pain.  There are no other associated systemic symptoms, there are no other alleviating or modifying factors.    Past Medical History  Diagnosis Date  . MRSA infection    History reviewed. No pertinent past surgical history. History reviewed. No pertinent family history. History  Substance Use Topics  . Smoking status: Never Smoker   . Smokeless tobacco: Not on file  . Alcohol Use: No    Review of Systems  Skin: Positive for wound.  All other systems reviewed and are negative.   Allergies  Review of patient's allergies indicates no known allergies.  Home Medications   Prior to Admission medications   Medication Sig Start Date End Date Taking? Authorizing Provider  clindamycin (CLEOCIN) 300 MG capsule Take 1 capsule (300 mg total) by mouth 3 (three) times daily. 08/13/13   Ethelda Chick, MD  ibuprofen (ADVIL,MOTRIN) 800 MG tablet Take 1 tablet (800 mg total) by mouth every 8 (eight) hours as needed for pain. 04/09/12   Charles B. Bernette Mayers, MD  oxyCODONE-acetaminophen (PERCOCET/ROXICET) 5-325  MG per tablet Take 1-2 tablets by mouth every 6 (six) hours as needed for pain. 04/09/12   Charles B. Bernette Mayers, MD  oxyCODONE-acetaminophen (PERCOCET/ROXICET) 5-325 MG per tablet Take 1-2 tablets by mouth every 6 (six) hours as needed for severe pain. 08/13/13   Ethelda Chick, MD  sulfamethoxazole-trimethoprim (BACTRIM DS) 800-160 MG per tablet Take 1 tablet by mouth 2 (two) times daily. 08/11/13   Enid Skeens, MD  tamsulosin (FLOMAX) 0.4 MG CAPS Take 1 capsule (0.4 mg total) by mouth daily. 04/09/12   Charles B. Bernette Mayers, MD   Triage Vitals: BP 129/83  Pulse 77  Temp(Src) 98.4 F (36.9 C) (Oral)  Resp 16  Ht 5\' 5"  (1.651 m)  Wt 181 lb (82.101 kg)  BMI 30.12 kg/m2  SpO2 98%  Physical Exam  Nursing note and vitals reviewed. Constitutional: He is oriented to person, place, and time. He appears well-developed and well-nourished.  HENT:  Head: Normocephalic and atraumatic.  Eyes: EOM are normal.  Neck: Normal range of motion.  Cardiovascular: Normal rate.   Pulmonary/Chest: Effort normal.  Musculoskeletal: Normal range of motion.  Neurological: He is alert and oriented to person, place, and time.  Skin: Skin is warm and dry. There is erythema.  Left posterior thigh has an area about 2 cm in diameter of erythema and induration with about 1 cm central ulcerated area.  No fluctuance.  Erythema  does not extend past the borders marked at prior visit.  No active pus drainage.    Psychiatric: He has a normal mood and affect. His behavior is normal.    ED Course  Procedures (including critical care time)  DIAGNOSTIC STUDIES: Oxygen Saturation is 98% on room air, normal by my interpretation.    COORDINATION OF CARE: 10:24 PM- Discussed changing the patient's bandages and starting the patient on Clindamycin and pain medciation.  Advised the patient to recheck the area in 48 hours.  The patient agreed to the treatment plan.     Labs Review Labs Reviewed - No data to display  Imaging  Review No results found.   EKG Interpretation None      MDM   Final diagnoses:  Cellulitis and abscess of lower extremity    Pt presenting with c/o continued pain and redness around the site of an abscess on his posterior thigh after I and D.  No prurulent drainage.  Area of cellulitis has not gone outside the borders marked at prior visit.  Pt given rx for pain medication, will switch to clindamycin for continued cellultiis.  No area of fluctuance so I do not believe repeat I and D will help.  Pt advised to return in 48 hours for recheck.  Discharged with strict return precautions.  Pt agreeable with plan.  I personally performed the services described in this documentation, which was scribed in my presence. The recorded information has been reviewed and is accurate.    Ethelda ChickMartha K Linker, MD 08/14/13 [phone]

## 2013-08-13 NOTE — ED Notes (Signed)
Pt reports having an abscess to left posterior thigh drained on Tuesday at this facility - here for a wound check, admits to continued drainage from site and slight increase in pain and swelling - denies fever.

## 2013-08-13 NOTE — Discharge Instructions (Signed)
Return to the ED with any concerns including fever/chills, increased area of redness, vomiting and not able to keep down liquids or antibiotics, decreased level of alertness/lethargy, or any other alarming symptoms °

## 2013-08-13 NOTE — ED Notes (Signed)
Here for recheck of abscess of left thigh  Red and swollen

## 2014-03-19 ENCOUNTER — Encounter (HOSPITAL_BASED_OUTPATIENT_CLINIC_OR_DEPARTMENT_OTHER): Payer: Self-pay | Admitting: *Deleted

## 2014-03-19 ENCOUNTER — Emergency Department (HOSPITAL_BASED_OUTPATIENT_CLINIC_OR_DEPARTMENT_OTHER)
Admission: EM | Admit: 2014-03-19 | Discharge: 2014-03-19 | Disposition: A | Discharge status: Home / Self Care | Payer: Self-pay | Attending: Emergency Medicine | Admitting: Emergency Medicine

## 2014-03-19 DIAGNOSIS — H00016 Hordeolum externum left eye, unspecified eyelid: Secondary | ICD-10-CM

## 2014-03-19 DIAGNOSIS — H00024 Hordeolum internum left upper eyelid: Secondary | ICD-10-CM | POA: Insufficient documentation

## 2014-03-19 DIAGNOSIS — Z79899 Other long term (current) drug therapy: Secondary | ICD-10-CM | POA: Insufficient documentation

## 2014-03-19 DIAGNOSIS — Z792 Long term (current) use of antibiotics: Secondary | ICD-10-CM | POA: Insufficient documentation

## 2014-03-19 DIAGNOSIS — Z8614 Personal history of Methicillin resistant Staphylococcus aureus infection: Secondary | ICD-10-CM | POA: Insufficient documentation

## 2014-03-19 MED ORDER — ERYTHROMYCIN 5 MG/GM OP OINT: TOPICAL_OINTMENT | OPHTHALMIC | Status: DC

## 2014-03-19 NOTE — ED Provider Notes (Signed)
CSN: [id number]     Arrival date & time 03/19/14  1223 History   First MD Initiated Contact with Patient 03/19/14 1320     Chief Complaint  Patient presents with  . Facial Swelling     (Consider location/radiation/quality/duration/timing/severity/associated sxs/prior Treatment) Patient is a 29 y.o. male presenting with eye problem.  Eye Problem Location:  L eye Quality:  Stinging Severity:  Moderate Onset quality:  Gradual Duration:  2 days Timing:  Constant Progression:  Worsening Chronicity:  New Context comment:  Atraumatic Relieved by:  Nothing Worsened by:  Nothing tried Ineffective treatments:  None tried Associated symptoms: swelling and tearing   Associated symptoms: no blurred vision, no decreased vision, no discharge, no itching, no nausea, no tingling and no vomiting     Past Medical History  Diagnosis Date  . MRSA infection    History reviewed. No pertinent past surgical history. No family history on file. History  Substance Use Topics  . Smoking status: Never Smoker   . Smokeless tobacco: Not on file  . Alcohol Use: No    Review of Systems  Eyes: Negative for blurred vision, discharge and itching.  Gastrointestinal: Negative for nausea and vomiting.  Neurological: Negative for tingling.  All other systems reviewed and are negative.     Allergies  Review of patient's allergies indicates no known allergies.  Home Medications   Prior to Admission medications   Medication Sig Start Date End Date Taking? Authorizing Provider  clindamycin (CLEOCIN) 300 MG capsule Take 1 capsule (300 mg total) by mouth 3 (three) times daily. 08/13/13   Ethelda Chick, MD  erythromycin ophthalmic ointment Place a 1/2 inch ribbon of ointment into the lower eyelid of L eye BID x 7 days. 03/19/14   Mirian Mo, MD  ibuprofen (ADVIL,MOTRIN) 800 MG tablet Take 1 tablet (800 mg total) by mouth every 8 (eight) hours as needed for pain. 04/09/12   Charles B. Bernette Mayers, MD   oxyCODONE-acetaminophen (PERCOCET/ROXICET) 5-325 MG per tablet Take 1-2 tablets by mouth every 6 (six) hours as needed for pain. 04/09/12   Charles B. Bernette Mayers, MD  oxyCODONE-acetaminophen (PERCOCET/ROXICET) 5-325 MG per tablet Take 1-2 tablets by mouth every 6 (six) hours as needed for severe pain. 08/13/13   Ethelda Chick, MD  sulfamethoxazole-trimethoprim (BACTRIM DS) 800-160 MG per tablet Take 1 tablet by mouth 2 (two) times daily. 08/11/13   Enid Skeens, MD  tamsulosin (FLOMAX) 0.4 MG CAPS Take 1 capsule (0.4 mg total) by mouth daily. 04/09/12   Charles B. Bernette Mayers, MD   BP 119/80 mmHg  Pulse 82  Temp(Src) 98.4 F (36.9 C) (Oral)  Resp 14  Ht  (1.651 m)  Wt 178 lb (80.74 kg)  BMI 29.62 kg/m2  SpO2 100% Physical Exam  Constitutional: He is oriented to person, place, and time. He appears well-developed and well-nourished.  HENT:  Head: Normocephalic and atraumatic.  Eyes: EOM are normal. Pupils are equal, round, and reactive to light. Right conjunctiva is injected. Left conjunctiva is injected.  Swelling of L superior eyelid, blepharitis vs early stye  Neck: Normal range of motion. Neck supple.  Cardiovascular: Normal rate, regular rhythm and normal heart sounds.   Pulmonary/Chest: Effort normal and breath sounds normal. No respiratory distress.  Abdominal: He exhibits no distension. There is no tenderness. There is no rebound and no guarding.  Musculoskeletal: Normal range of motion.  Neurological: He is alert and oriented to person, place, and time.  Skin: Skin is warm and dry.  Vitals reviewed.   ED Course  Procedures (including critical care time) Labs Review Labs Reviewed - No data to display  Imaging Review No results found.   EKG Interpretation None      MDM   Final diagnoses:  Stye, left    29 y.o. male without pertinent PMH presents with L eyelid swelling.  No visual complaint, states he occasionally sees the swelling, but no frank visual change.   Physical exam with blepharitis vs stye.  Standard return precautions and erythromycin given.  DC home in stable condition.    I have reviewed all laboratory and imaging studies if ordered as above  1. Stye, left         Mirian MoMatthew Gentry, MD 03/19/14 [phone]

## 2014-03-19 NOTE — Discharge Instructions (Signed)
Sty °A sty (hordeolum) is an infection of a gland in the eyelid located at the base of the eyelash. A sty may develop a white or yellow head of pus. It can be puffy (swollen). Usually, the sty will burst and pus will come out on its own. They do not leave lumps in the eyelid once they drain. °A sty is often confused with another form of cyst of the eyelid called a chalazion. Chalazions occur within the eyelid and not on the edge where the bases of the eyelashes are. They often are red, sore and then form firm lumps in the eyelid. °CAUSES  °· Germs (bacteria). °· Lasting (chronic) eyelid inflammation. °SYMPTOMS  °· Tenderness, redness and swelling along the edge of the eyelid at the base of the eyelashes. °· Sometimes, there is a white or yellow head of pus. It may or may not drain. °DIAGNOSIS  °An ophthalmologist will be able to distinguish between a sty and a chalazion and treat the condition appropriately.  °TREATMENT  °· Styes are typically treated with warm packs (compresses) until drainage occurs. °· In rare cases, medicines that kill germs (antibiotics) may be prescribed. These antibiotics may be in the form of drops, cream or pills. °· If a hard lump has formed, it is generally necessary to do a small incision and remove the hardened contents of the cyst in a minor surgical procedure done in the office. °· In suspicious cases, your caregiver may send the contents of the cyst to the lab to be certain that it is not a rare, but dangerous form of cancer of the glands of the eyelid. °HOME CARE INSTRUCTIONS  °· Wash your hands often and dry them with a clean towel. Avoid touching your eyelid. This may spread the infection to other parts of the eye. °· Apply heat to your eyelid for 10 to 20 minutes, several times a day, to ease pain and help to heal it faster. °· Do not squeeze the sty. Allow it to drain on its own. Wash your eyelid carefully 3 to 4 times per day to remove any pus. °SEEK IMMEDIATE MEDICAL CARE IF:    °· Your eye becomes painful or puffy (swollen). °· Your vision changes. °· Your sty does not drain by itself within 3 days. °· Your sty comes back within a short period of time, even with treatment. °· You have redness (inflammation) around the eye. °· You have a fever. °Document Released: 10/10/2004 Document Revised: 03/25/2011 Document Reviewed: 04/16/2013 °ExitCare® Patient Information ©2015 [employer]. This information is not intended to replace advice given to you by your health care provider. Make sure you discuss any questions you have with your health care provider. ° °Blepharitis °Blepharitis is redness, soreness, and swelling (inflammation) of one or both eyelids. It may be caused by an allergic reaction or a bacterial infection. Blepharitis may also be associated with reddened, scaly skin (seborrhea) of the scalp and eyebrows. While you sleep, eye discharge may cause your eyelashes to stick together. Your eyelids may itch, burn, swell, and may lose their lashes. These will grow back. Your eyes may become sensitive. Blepharitis may recur and need repeated treatment. If this is the case, you may require further evaluation by an eye specialist (ophthalmologist). °HOME CARE INSTRUCTIONS  °· Keep your hands clean. °· Use a clean towel each time you dry your eyelids. Do not use this towel to clean other areas. Do not share a towel or makeup with anyone. °· Wash your   eyelids with warm water or warm water mixed with a small amount of baby shampoo. Do this twice a day or as often as needed. °· Wash your face and eyebrows at least once a day. °· Use warm compresses 2 times a day for 10 minutes at a time, or as directed by your caregiver. °· Apply antibiotic ointment as directed by your caregiver. °· Avoid rubbing your eyes. °· Avoid wearing makeup until you get better. °· Follow up with your caregiver as directed. °SEEK IMMEDIATE MEDICAL CARE IF:  °· You have pain, redness, or swelling that gets worse or  spreads to other parts of your face. °· Your vision changes, or you have pain when looking at lights or moving objects. °· You have a fever. °· Your symptoms continue for longer than 2 to 4 days or become worse. °MAKE SURE YOU:  °· Understand these instructions. °· Will watch your condition. °· Will get help right away if you are not doing well or get worse. °Document Released: 12/29/1999 Document Revised: 03/25/2011 Document Reviewed: 02/07/2010 °ExitCare® Patient Information ©2015 [employer]. This information is not intended to replace advice given to you by your health care provider. Make sure you discuss any questions you have with your health care provider. ° °

## 2014-03-19 NOTE — ED Notes (Signed)
C/o left eye swelling since yesterday. No redness noted. Has noticed on and off blurriness in that eye. No injury.

## 2014-03-23 ENCOUNTER — Emergency Department (HOSPITAL_BASED_OUTPATIENT_CLINIC_OR_DEPARTMENT_OTHER)
Admission: EM | Admit: 2014-03-23 | Discharge: 2014-03-23 | Disposition: A | Discharge status: Home / Self Care | Payer: Self-pay | Attending: Emergency Medicine | Admitting: Emergency Medicine

## 2014-03-23 ENCOUNTER — Encounter (HOSPITAL_BASED_OUTPATIENT_CLINIC_OR_DEPARTMENT_OTHER): Payer: Self-pay | Admitting: *Deleted

## 2014-03-23 DIAGNOSIS — Z8614 Personal history of Methicillin resistant Staphylococcus aureus infection: Secondary | ICD-10-CM | POA: Insufficient documentation

## 2014-03-23 DIAGNOSIS — Z79899 Other long term (current) drug therapy: Secondary | ICD-10-CM | POA: Insufficient documentation

## 2014-03-23 DIAGNOSIS — Z792 Long term (current) use of antibiotics: Secondary | ICD-10-CM | POA: Insufficient documentation

## 2014-03-23 DIAGNOSIS — H00016 Hordeolum externum left eye, unspecified eyelid: Secondary | ICD-10-CM

## 2014-03-23 DIAGNOSIS — H00014 Hordeolum externum left upper eyelid: Secondary | ICD-10-CM | POA: Insufficient documentation

## 2014-03-23 NOTE — ED Notes (Signed)
Pt has a swollen left eyelid. He was seen here 4 days ago and prescribed erythromycin ointment. Pt sts he has been using the ointment as directed but the stye is not improving.

## 2014-03-23 NOTE — ED Provider Notes (Signed)
CSN: [id number]     Arrival date & time 03/23/14  1254 History   First MD Initiated Contact with Patient 03/23/14 1446     Chief Complaint  Patient presents with  . Eye Problem     (Consider location/radiation/quality/duration/timing/severity/associated sxs/prior Treatment) HPI   Patrick Donovan is a 29 y.o. male complaining of persistent stye to left upper eyelid. Patient was seen here 4 days ago was started on erythromycin ointment which she has been using appropriately. He's been using warm compresses proximally 7 times per day. He denies any change in his vision, pain with eye movement, fever, chills. He states that the size is improving however it still persists. Patient has had to have styes lanced in the past. He does not have a ophthalmologist that he follows with.  Past Medical History  Diagnosis Date  . MRSA infection    History reviewed. No pertinent past surgical history. No family history on file. History  Substance Use Topics  . Smoking status: Never Smoker   . Smokeless tobacco: Not on file  . Alcohol Use: No    Review of Systems  10 systems reviewed and found to be negative, except as noted in the HPI.   Allergies  Review of patient's allergies indicates no known allergies.  Home Medications   Prior to Admission medications   Medication Sig Start Date End Date Taking? Authorizing Provider  clindamycin (CLEOCIN) 300 MG capsule Take 1 capsule (300 mg total) by mouth 3 (three) times daily. 08/13/13   Jerelyn Scott, MD  erythromycin ophthalmic ointment Place a 1/2 inch ribbon of ointment into the lower eyelid of L eye BID x 7 days. 03/19/14   Noel Gerold, MD  ibuprofen (ADVIL,MOTRIN) 800 MG tablet Take 1 tablet (800 mg total) by mouth every 8 (eight) hours as needed for pain. 04/09/12   Susy Frizzle, MD  oxyCODONE-acetaminophen (PERCOCET/ROXICET) 5-325 MG per tablet Take 1-2 tablets by mouth every 6 (six) hours as needed for pain. 04/09/12   Susy Frizzle, MD    oxyCODONE-acetaminophen (PERCOCET/ROXICET) 5-325 MG per tablet Take 1-2 tablets by mouth every 6 (six) hours as needed for severe pain. 08/13/13   Jerelyn Scott, MD  sulfamethoxazole-trimethoprim (BACTRIM DS) 800-160 MG per tablet Take 1 tablet by mouth 2 (two) times daily. 08/11/13   Blane Ohara, MD  tamsulosin (FLOMAX) 0.4 MG CAPS Take 1 capsule (0.4 mg total) by mouth daily. 04/09/12   Susy Frizzle, MD   BP 140/89 mmHg  Pulse 90  Temp(Src) 98.4 F (36.9 C) (Oral)  Resp 16  Wt 178 lb (80.74 kg)  SpO2 99% Physical Exam  Constitutional: He is oriented to person, place, and time. He appears well-developed and well-nourished. No distress.  HENT:  Head: Normocephalic and atraumatic.  Mouth/Throat: Oropharynx is clear and moist.  Eyes: Conjunctivae and EOM are normal. Pupils are equal, round, and reactive to light. Left eye exhibits hordeolum.    Neck: Normal range of motion.  Cardiovascular: Normal rate, regular rhythm and intact distal pulses.   Pulmonary/Chest: Effort normal and breath sounds normal. No stridor. No respiratory distress. He has no wheezes. He has no rales. He exhibits no tenderness.  Abdominal: Soft. Bowel sounds are normal. He exhibits no distension and no mass. There is no tenderness. There is no rebound and no guarding.  Musculoskeletal: Normal range of motion.  Neurological: He is alert and oriented to person, place, and time.  Psychiatric: He has a normal mood and affect.  Nursing note and vitals reviewed.  ED Course  Procedures (including critical care time) Labs Review Labs Reviewed - No data to display  Imaging Review No results found.   EKG Interpretation None      MDM   Final diagnoses:  Hordeolum external, left    Filed Vitals:   03/23/14 1300 03/23/14 1533  BP: 140/89 131/85  Pulse: 90 65  Temp: 98.4 F (36.9 C)   TempSrc: Oral   Resp: 16 16  Weight: 178 lb (80.74 kg)   SpO2: 99% 100%    Patrick Donovan is a pleasant 29 y.o.  male presenting with Asst. Patrick Donovan to left upper lid. He states that this is improving with erythromycin and warm compresses will be given an ophthalmology referral for possible incision and drainage.  Evaluation does not show pathology that would require ongoing emergent intervention or inpatient treatment. Pt is hemodynamically stable and mentating appropriately. Discussed findings and plan with patient/guardian, who agrees with care plan. All questions answered. Return precautions discussed and outpatient follow up given.     Wynetta Emeryicole Niaja Stickley, PA-C 03/23/14 1727  Purvis SheffieldForrest Harrison, MD 03/24/14 [id number]

## 2014-03-23 NOTE — Discharge Instructions (Signed)
Do not hesitate to return to the emergency room for any new, worsening or concerning symptoms.  Please obtain primary care using resource guide below. But the minute you were seen in the emergency room and that they will need to obtain records for further outpatient management.   Sty A sty (hordeolum) is an infection of a gland in the eyelid located at the base of the eyelash. A sty may develop a white or yellow head of pus. It can be puffy (swollen). Usually, the sty will burst and pus will come out on its own. They do not leave lumps in the eyelid once they drain. A sty is often confused with another form of cyst of the eyelid called a chalazion. Chalazions occur within the eyelid and not on the edge where the bases of the eyelashes are. They often are red, sore and then form firm lumps in the eyelid. CAUSES   Germs (bacteria).  Lasting (chronic) eyelid inflammation. SYMPTOMS   Tenderness, redness and swelling along the edge of the eyelid at the base of the eyelashes.  Sometimes, there is a white or yellow head of pus. It may or may not drain. DIAGNOSIS  An ophthalmologist will be able to distinguish between a sty and a chalazion and treat the condition appropriately.  TREATMENT   Styes are typically treated with warm packs (compresses) until drainage occurs.  In rare cases, medicines that kill germs (antibiotics) may be prescribed. These antibiotics may be in the form of drops, cream or pills.  If a hard lump has formed, it is generally necessary to do a small incision and remove the hardened contents of the cyst in a minor surgical procedure done in the office.  In suspicious cases, your caregiver may send the contents of the cyst to the lab to be certain that it is not a rare, but dangerous form of cancer of the glands of the eyelid. HOME CARE INSTRUCTIONS   Wash your hands often and dry them with a clean towel. Avoid touching your eyelid. This may spread the infection to other  parts of the eye.  Apply heat to your eyelid for 10 to 20 minutes, several times a day, to ease pain and help to heal it faster.  Do not squeeze the sty. Allow it to drain on its own. Wash your eyelid carefully 3 to 4 times per day to remove any pus. SEEK IMMEDIATE MEDICAL CARE IF:   Your eye becomes painful or puffy (swollen).  Your vision changes.  Your sty does not drain by itself within 3 days.  Your sty comes back within a short period of time, even with treatment.  You have redness (inflammation) around the eye.  You have a fever. Document Released: 10/10/2004 Document Revised: 03/25/2011 Document Reviewed: 04/16/2013 [facility] Patient Information 2015 [place], [place]. This information is not intended to replace advice given to you by your health care provider. Make sure you discuss any questions you have with your health care provider.   Emergency Department Resource Guide 1) Find a Doctor and Pay Out of Pocket Although you won't have to find out who is covered by your insurance plan, it is a good idea to ask around and get recommendations. You will then need to call the office and see if the doctor you have chosen will accept you as a new patient and what types of options they offer for patients who are self-pay. Some doctors offer discounts or will set up payment plans for their patients  who do not have insurance, but you will need to ask so you aren't surprised when you get to your appointment.  2) Contact Your Local Health Department Not all health departments have doctors that can see patients for sick visits, but many do, so it is worth a call to see if yours does. If you don't know where your local health department is, you can check in your phone book. The CDC also has a tool to help you locate your state's health department, and many state websites also have listings of all of their local health departments.  3) Find a Walk-in Clinic If your illness is not likely to be  very severe or complicated, you may want to try a walk in clinic. These are popping up all over the country in pharmacies, drugstores, and shopping centers. They're usually staffed by nurse practitioners or physician assistants that have been trained to treat common illnesses and complaints. They're usually fairly quick and inexpensive. However, if you have serious medical issues or chronic medical problems, these are probably not your best option.  No Primary Care Doctor: - Call Health Connect at  [phone] - they can help you locate a primary care doctor that  accepts your insurance, provides certain services, etc. - Physician Referral Service- [phone]  Chronic Pain Problems: Organization         Address  Phone   Notes  [facility]  [phone] Patients need to be referred by their primary care doctor.   Medication Assistance: Organization         Address  Phone   Notes  [facility] [address] [place], [place] [id number] [phone] --Must be a resident of [place] -- Must have NO insurance coverage whatsoever (no Medicaid/ Medicare, etc.) -- The pt. MUST have a primary care doctor that directs their care regularly and follows them in the community   Med[facility]ist  [phone]   [employer]  [phone]    Agencies that provide inexpensive medical care: Organization         Address  Phone   Notes  [facility]  [phone]   [facility]    [phone]   [facility] [address] [place], [place] [address] [phone]) [phone]   [facility] [address], [place] [phone]   [facility]anned Parenthood    [phone]   [facility]    [phone]) [phone]   [facility]  [address], [place] Phone:  [phone], Fax:  [phone] Hours of Operation:  9 am - 6 pm, M-F.  Also  accepts Medicaid/Medicare and self-pay.  [facility]  [address], [place] Phone: [phone], Fax: [phone]. Hours of Operation:  8:30 am - 5:30 pm, M-F.  Also accepts Medicaid and self-pay.  [facility] [place] [address], [place] Phone: [phone]   [facility] [address], [place], [place] [phone], Ext. [phone] Mondays & Thursdays: 7-9 AM.  First 15 patients are seen on a first come, first serve basis.    Medicaid-accepting [facility] Providers:  Organization         Address  Phone   Notes  [facility] [address], [place] [phone] Also accepts self-pay patients.  [facility] [address] Laurell Josephs [place], [place]  [phone]   [facility] [address], [place] [phone]   [facility] [address], [place] [phone]   Renaye Rakers [address], [place]   [phone] Only accepts [place] Access [place] patients after they have their name applied to their card.   Self-Pay (no insurance) in [facility]:  Organization         Address  Phone   Notes  Sickle Cell Patients, [facility] [address] [place], [place] [phone]   [facility] [address] [place], [place] [phone]   [facility]  [address], [place] [phone]   [facility]/Dr. Osei-Bonsu  [address] [place] or [address], [place] [phone] Phone number for both [place] and [place] locations is the same.  [facility] [address] [place] [phone]   [facility] [address], [place] or [address] [phone]   [facility] [address],  [place] [phone], phone; [phone], fax Sees patients 1st and 3rd Saturday of every month.  Must not qualify for public or private insurance (i.e. Medicaid, Medicare, [place] Health Choice, Veterans' Benefits)  Household income should be no more than 200% of the poverty level The clinic cannot treat you if you are pregnant or think you are pregnant  Sexually transmitted diseases are not treated at the clinic.    Dental Care: Organization         Address  Phone  Notes  [facility] Department of [facility] [address] [place], [place] [phone] Accepts children up to age 100 who are enrolled in [place] or [place] Health Choice; pregnant women with a Medicaid card; and children who have applied for Medicaid or [place] Health Choice, but were declined, whose parents can pay a reduced fee at time of service.  [facility] of [facility]  [address], [place] [phone] Accepts children up to age 40 who are enrolled in [place] or [place] Health Choice; pregnant women with a Medicaid card; and children who have applied for Medicaid or  Health Choice, but were declined, whose parents can pay a reduced fee at time of service.  [facility]  [address] [place], [place] [phone] Patients are seen by appointment only. Walk-ins are not accepted. [facility] will see patients 41 years of age and older. Monday - Tuesday (8am-5pm) Most Wednesdays (8:30-5pm) $30 per visit, cash only  [facility]  [address], [facility] [phone] Patients are seen by appointment only. Walk-ins are not accepted. [facility] will see patients 34 years of age and older. One Wednesday Evening (Monthly: Volunteer Based).  $30 per visit, cash only  [employer]  [phone] for adults; Children under age 55, call Graduate Pediatric Dentistry at [phone].  Children aged 42-14, please call [phone] to request a pediatric application.  Dental services are provided in all areas of dental care including fillings, crowns and bridges, complete and partial dentures, implants, gum treatment, root canals, and extractions. Preventive care is also provided. Treatment is provided to both adults and children.  Patients are selected via a lottery and there is often a waiting list.   [facility] [address], [place]  [phone] [email or web address]   [facility] [address] [place], [place] [phone], Ext. [phone] Second and Fourth Thursday of each month, opens at 6:30 AM; Clinic ends at 9 AM.  Patients are seen on a first-come first-served basis, and a limited number are seen during each clinic.   [facility]  [address] Ether Griffins [place], [place] [phone]   Eligibility Requirements You must have lived in [place], [place], or [place] counties for at least the last three months.   You cannot be eligible for state or federal sponsored [place], including [employer], [place], or [employer].   You generally cannot be eligible for healthcare insurance through your employer.    How to apply: Eligibility screenings are held every Tuesday and Wednesday afternoon from 1:00 pm until 4:00 pm. You do not need an appointment for the interview!  [facility] [address] [place], [place] [phone]   [facility] Health Department  [phone]   [facility] Health Department  [phone]   [facility] Health Department  [phone]    Behavioral Health Resources in the Community: Intensive Outpatient Programs Organization         Address  Phone  Notes  [facility] [address] [place], [place] [phone]   [facility] Outpatient [address] [place], [place] [phone]   [facility] [address], [place], [place]  [phone]   [facility] [address],  [place], [place] [phone] or [phone]   Substance Abuse Resources Organization         Address  Phone  Notes  [facility]  [phone]   [facility]  [phone]   [facility]  [phone]   Floydene Flock  [phone]   Residential & Outpatient Substance Abuse Program  [phone]   Psychological Services Organization         Address  Phone  Notes  [facility]  [phone]   [facility]  [phone]   [facility] [address], [place] [phone] or [phone]    Mobile Crisis Teams Organization         Address  Phone  Notes  [facility], Mobile Crisis Care Unit  [phone]   [facility]  [address] [place], [place] [phone]   Doristine Locks [address] [place] [phone]    Self-Help/Support Groups Organization         Address  Phone             Notes  [facility] [place] - variety of support groups  336- I7437963 Call for more information  Narcotics Anonymous (NA), [facility] [address], [employer] [place]  2 meetings at this location   Statistician         Address  Phone  Notes  [facility] [address],    [place]  [phone]   [facility]  [address], [place] [address], [place], [place] [phone]   [facility] [address] [place], [place] [phone] Admissions: 8am-3pm M-F  [facility] [address],    [place], [place] [phone]   [facility] [phone]  [address] Leonard Schwartz [place], [place] [phone]   [facility] [address],  [place], [place] [phone]   [facility] - Intensive Outpatient [address] Laurell Josephs [address], [place], [place] [phone]     [facility] ([facility] [address]  [place], [place] [phone] or [phone]   [facility] ([facility]) [address], [place], [place] [phone] Accepts Medicaid  [facility] [address],  [place] [phone] Substance Abuse/Addiction Treatment   [facility] Organization         Address  Phone  Notes  [facility]  [phone]   Angie Fava, PhD [address] Ervin Knack [place], [place]   [phone] or [phone]   [facility] Behavioral   [address] [place], [place] [phone]   [facility] [address], [place], [place] [phone] Insurance/Medicaid/sponsorship through [facility] [address]                                    [place], [place] [phone] Therapy/tele-psych/case  [facility] [address], [place] [phone]    Dr. Lolly Mustache  [phone]   [facility]  [employer] [facility] Dept. 1) [address], [place] 2) [address] [place] 3)  [address], [place] [phone]  [phone]   [facility] Child Abuse Hotline 819-719-8150 or [phone] (After Hours)

## 2015-04-25 ENCOUNTER — Other Ambulatory Visit: Payer: Self-pay | Admitting: Sports Medicine

## 2016-03-26 ENCOUNTER — Encounter (HOSPITAL_BASED_OUTPATIENT_CLINIC_OR_DEPARTMENT_OTHER): Payer: Self-pay | Admitting: *Deleted

## 2016-03-26 ENCOUNTER — Emergency Department (HOSPITAL_BASED_OUTPATIENT_CLINIC_OR_DEPARTMENT_OTHER)
Admission: EM | Admit: 2016-03-26 | Discharge: 2016-03-26 | Disposition: A | Discharge status: Home / Self Care | Payer: Self-pay | Attending: Emergency Medicine | Admitting: Emergency Medicine

## 2016-03-26 ENCOUNTER — Emergency Department (HOSPITAL_BASED_OUTPATIENT_CLINIC_OR_DEPARTMENT_OTHER): Payer: Self-pay

## 2016-03-26 DIAGNOSIS — J705 Respiratory conditions due to smoke inhalation: Secondary | ICD-10-CM | POA: Insufficient documentation

## 2016-03-26 DIAGNOSIS — T59811A Toxic effect of smoke, accidental (unintentional), initial encounter: Secondary | ICD-10-CM

## 2016-03-26 NOTE — ED Notes (Signed)
Pt walking and talking on cell phone at same time, no acute distress or any apparent distress noted.

## 2016-03-26 NOTE — ED Notes (Signed)
Patient denies pain and is resting comfortably.  
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
## 2016-03-26 NOTE — Discharge Instructions (Signed)
Follow-up with a primary care provider with attached financial resource guide in 1-2 weeks regarding today's visit. You should not go to an environment where you will become toxic again.  SEEK IMMEDIATE MEDICAL CARE IF: Your symptoms get worse. You have increasing shortness of breath or wheezing. Your skin or your lips appear very pale or blue. You have a persistent cough. You cough up blood or dark material. You have chest pain or weakness. You have a fever. You faint.

## 2016-03-26 NOTE — ED Notes (Signed)
EDP now at bedside
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
## 2018-02-26 ENCOUNTER — Emergency Department (HOSPITAL_BASED_OUTPATIENT_CLINIC_OR_DEPARTMENT_OTHER): Payer: Self-pay

## 2018-02-26 ENCOUNTER — Encounter (HOSPITAL_BASED_OUTPATIENT_CLINIC_OR_DEPARTMENT_OTHER): Payer: Self-pay | Admitting: Emergency Medicine

## 2018-02-26 ENCOUNTER — Other Ambulatory Visit: Payer: Self-pay

## 2018-02-26 ENCOUNTER — Emergency Department (HOSPITAL_BASED_OUTPATIENT_CLINIC_OR_DEPARTMENT_OTHER)
Admission: EM | Admit: 2018-02-26 | Discharge: 2018-02-26 | Disposition: A | Discharge status: Home / Self Care | Payer: Self-pay | Attending: Emergency Medicine | Admitting: Emergency Medicine

## 2018-02-26 DIAGNOSIS — K760 Fatty (change of) liver, not elsewhere classified: Secondary | ICD-10-CM | POA: Insufficient documentation

## 2018-02-26 DIAGNOSIS — R17 Unspecified jaundice: Secondary | ICD-10-CM

## 2018-02-26 DIAGNOSIS — Z79899 Other long term (current) drug therapy: Secondary | ICD-10-CM | POA: Insufficient documentation

## 2018-02-26 DIAGNOSIS — K859 Acute pancreatitis without necrosis or infection, unspecified: Secondary | ICD-10-CM | POA: Insufficient documentation

## 2018-02-26 LAB — COMPREHENSIVE METABOLIC PANEL
ALBUMIN: 5 g/dL (ref 3.5–5.0)
ALT: 28 U/L (ref 0–44)
AST: 24 U/L (ref 15–41)
Alkaline Phosphatase: 73 U/L (ref 38–126)
Anion gap: 11 (ref 5–15)
BUN: 13 mg/dL (ref 6–20)
CHLORIDE: 102 mmol/L (ref 98–111)
CO2: 23 mmol/L (ref 22–32)
Calcium: 9.7 mg/dL (ref 8.9–10.3)
Creatinine, Ser: 1.01 mg/dL (ref 0.61–1.24)
GFR calc Af Amer: >60 mL/min (ref >60)
GLUCOSE: 203 mg/dL — AB (ref 70–99)
POTASSIUM: 3.9 mmol/L (ref 3.5–5.1)
Sodium: 136 mmol/L (ref 135–145)
Total Bilirubin: 3.7 mg/dL — ABNORMAL HIGH (ref 0.3–1.2)
Total Protein: 8.4 g/dL — ABNORMAL HIGH (ref 6.5–8.1)

## 2018-02-26 LAB — CBC WITH DIFFERENTIAL/PLATELET
ABS IMMATURE GRANULOCYTES: 0.01 10*3/uL (ref 0.00–0.07)
BASOS PCT: 1 %
Basophils Absolute: 0 10*3/uL (ref 0.0–0.1)
EOS ABS: 0 10*3/uL (ref 0.0–0.5)
Eosinophils Relative: 1 %
HCT: 47.6 % (ref 39.0–52.0)
Hemoglobin: 16 g/dL (ref 13.0–17.0)
IMMATURE GRANULOCYTES: 0 %
Lymphocytes Relative: 57 %
Lymphs Abs: 2.5 10*3/uL (ref 0.7–4.0)
MCH: 30 pg (ref 26.0–34.0)
MCHC: 33.6 g/dL (ref 30.0–36.0)
MCV: 89.3 fL (ref 80.0–100.0)
Monocytes Absolute: 0.3 10*3/uL (ref 0.1–1.0)
Monocytes Relative: 8 %
NEUTROS ABS: 1.4 10*3/uL — AB (ref 1.7–7.7)
NEUTROS PCT: 33 %
PLATELETS: 269 10*3/uL (ref 150–400)
RBC: 5.33 MIL/uL (ref 4.22–5.81)
RDW: 12 % (ref 11.5–15.5)
WBC: 4.2 10*3/uL (ref 4.0–10.5)
nRBC: 0 % (ref 0.0–0.2)

## 2018-02-26 LAB — LIPASE, BLOOD: LIPASE: 102 U/L — AB (ref 11–51)

## 2018-02-26 LAB — URINALYSIS, ROUTINE W REFLEX MICROSCOPIC
GLUCOSE, UA: 250 mg/dL — AB
Hgb urine dipstick: NEGATIVE
KETONES UR: 15 mg/dL — AB
LEUKOCYTE UA: NEGATIVE
NITRITE: NEGATIVE
PH: 6 (ref 5.0–8.0)
PROTEIN: NEGATIVE mg/dL
Specific Gravity, Urine: 1.025 (ref 1.005–1.030)

## 2018-02-26 MED ORDER — KETOROLAC TROMETHAMINE 60 MG/2ML IM SOLN
30.0000 mg | Freq: Once | INTRAMUSCULAR | Status: AC
  Administered 2018-02-26: 30 mg via INTRAMUSCULAR
  Filled 2018-02-26: qty 2

## 2018-02-26 MED ORDER — ONDANSETRON 4 MG PO TBDP: 4.0000 mg | ORAL_TABLET | Freq: Three times a day (TID) | ORAL | 0 refills | Status: DC | PRN

## 2018-02-26 MED ORDER — HYDROCODONE-ACETAMINOPHEN 5-325 MG PO TABS: 1.0000 | ORAL_TABLET | ORAL | 0 refills | Status: DC | PRN

## 2018-02-26 MED ORDER — ONDANSETRON 8 MG PO TBDP
8.0000 mg | ORAL_TABLET | Freq: Once | ORAL | Status: AC
  Administered 2018-02-26: 8 mg via ORAL
  Filled 2018-02-26: qty 1

## 2018-02-26 NOTE — ED Provider Notes (Signed)
[facility] EMERGENCY DEPARTMENT Provider Note   CSN: [id number] Arrival date & time: 02/26/18  1049     History   Chief Complaint Chief Complaint  Patient presents with  . Flank Pain    HPI Patrick Donovan is a 33 y.o. male who presents with left flank pain.  Past medical history significant for diabetes, history of kidney stones.  He states that he has been having intermittent left-sided pain for the past 2 weeks.  Over the past couple of days the pain has worsened.  It feels like prior kidney stones.  It is sharp and radiating from the flank to the left side of the abdomen.  He reports associated nausea.  Yesterday he passed out because the pain was really bad.  He denies fever, vomiting, hematuria, dysuria, penis or testicular pain.  He has had some difficulty urinating. He rates the pain is 5 out of 10.  His girlfriend has Zofran at home and he has been taking that for the nausea which has been helping.  He is never had to have any urologic procedure before for stone removal.  HPI  Past Medical History:  Diagnosis Date  . MRSA infection     There are no active problems to display for this patient.   History reviewed. No pertinent surgical history.      Home Medications    Prior to Admission medications   Medication Sig Start Date End Date Taking? Authorizing Provider  clindamycin (CLEOCIN) 300 MG capsule Take 1 capsule (300 mg total) by mouth 3 (three) times daily. 08/13/13   Phillis Haggis, MD  erythromycin ophthalmic ointment Place a 1/2 inch ribbon of ointment into the lower eyelid of L eye BID x 7 days. 03/19/14   Mirian Mo, MD  ibuprofen (ADVIL,MOTRIN) 800 MG tablet Take 1 tablet (800 mg total) by mouth every 8 (eight) hours as needed for pain. 04/09/12   Susy Frizzle, MD  METFORMIN HCL PO Take by mouth.    [provider]  oxyCODONE-acetaminophen (PERCOCET/ROXICET) 5-325 MG per tablet Take 1-2 tablets by mouth every 6 (six) hours as needed  for pain. 04/09/12   Susy Frizzle, MD  oxyCODONE-acetaminophen (PERCOCET/ROXICET) 5-325 MG per tablet Take 1-2 tablets by mouth every 6 (six) hours as needed for severe pain. 08/13/13   Mabe, Latanya Maudlin, MD  sulfamethoxazole-trimethoprim (BACTRIM DS) 800-160 MG per tablet Take 1 tablet by mouth 2 (two) times daily. 08/11/13   Blane Ohara, MD  tamsulosin (FLOMAX) 0.4 MG CAPS Take 1 capsule (0.4 mg total) by mouth daily. 04/09/12   Susy Frizzle, MD    Family History History reviewed. No pertinent family history.  Social History Social History   Tobacco Use  . Smoking status: Never Smoker  . Smokeless tobacco: Never Used  Substance Use Topics  . Alcohol use: No  . Drug use: No     Allergies   Patient has no known allergies.   Review of Systems Review of Systems  Constitutional: Negative for fever.  Gastrointestinal: Positive for abdominal pain and nausea. Negative for diarrhea and vomiting.  Genitourinary: Positive for difficulty urinating and flank pain. Negative for dysuria, hematuria, penile pain and testicular pain.  Neurological: Positive for syncope.  All other systems reviewed and are negative.    Physical Exam Updated Vital Signs BP (!) 145/89 (BP Location: Right Arm)   Pulse 85   Temp 98.4 F (36.9 C) (Oral)   Resp 18   Ht 5\' 5"  (1.651 m)  Wt 74.8 kg   SpO2 100%   BMI 27.46 kg/m   Physical Exam Vitals signs and nursing note reviewed.  Constitutional:      General: He is not in acute distress.    Appearance: Normal appearance. He is well-developed.     Comments: Calm, cooperative. Comfortable appearing  HENT:     Head: Normocephalic and atraumatic.  Eyes:     General: No scleral icterus.       Right eye: No discharge.        Left eye: No discharge.     Conjunctiva/sclera: Conjunctivae normal.     Pupils: Pupils are equal, round, and reactive to light.  Neck:     Musculoskeletal: Normal range of motion.  Cardiovascular:     Rate and Rhythm:  Normal rate and regular rhythm.  Pulmonary:     Effort: Pulmonary effort is normal. No respiratory distress.     Breath sounds: Normal breath sounds.  Abdominal:     General: There is no distension.     Palpations: Abdomen is soft.     Tenderness: There is no abdominal tenderness.  Skin:    General: Skin is warm and dry.  Neurological:     Mental Status: He is alert and oriented to person, place, and time.  Psychiatric:        Behavior: Behavior normal.      ED Treatments / Results  Labs (all labs ordered are listed, but only abnormal results are displayed) Labs Reviewed  URINALYSIS, ROUTINE W REFLEX MICROSCOPIC - Abnormal; Notable for the following components:      Result Value   Glucose, UA 250 (*)    Bilirubin Urine SMALL (*)    Ketones, ur 15 (*)    All other components within normal limits  CBC WITH DIFFERENTIAL/PLATELET - Abnormal; Notable for the following components:   Neutro Abs 1.4 (*)    All other components within normal limits  COMPREHENSIVE METABOLIC PANEL - Abnormal; Notable for the following components:   Glucose, Bld 203 (*)    Total Protein 8.4 (*)    Total Bilirubin 3.7 (*)    All other components within normal limits  LIPASE, BLOOD - Abnormal; Notable for the following components:   Lipase 102 (*)    All other components within normal limits    EKG None  Radiology [place] Abdomen Limited Ruq  Result Date: 02/26/2018 CLINICAL DATA:  Abdominal pain EXAM: ULTRASOUND ABDOMEN LIMITED RIGHT UPPER QUADRANT COMPARISON:  None. FINDINGS: Gallbladder: There is a 4 mm echogenic focus along the wall of the gallbladder anteriorly which does not move or shadow. Suspect small polyp. No echogenic foci which move and shadow as is expected with gallstones seen. No gallbladder wall thickening or pericholecystic fluid. No sonographic Murphy sign noted by sonographer. Common bile duct: Diameter: 2 mm. No intrahepatic or extrahepatic biliary duct dilatation. Liver: No focal  lesion identified. Liver echogenicity is increased diffusely. There is questionable mild fatty sparing near the gallbladder fossa. Portal vein is patent on color Doppler imaging with normal direction of blood flow towards the liver. IMPRESSION: 1. 4 mm echogenic focus in the gallbladder which neither moves nor shadows, a probable polyp. Per consensus guidelines, a polyp of this small size does not warrant additional imaging surveillance. Gallbladder otherwise appears unremarkable. 2. Diffuse increase in liver echogenicity, a finding most likely indicative of hepatic steatosis. There is probable mild fatty sparing near the gallbladder fossa. While no focal liver lesions are evident, it must be  cautioned that the sensitivity of ultrasound for detection of focal liver lesions is diminished in this circumstance. Electronically Signed   By: Bretta Bang III M.D.   On: 02/26/2018 14:45    Procedures Procedures (including critical care time)  Medications Ordered in ED Medications  ketorolac (TORADOL) injection 30 mg (30 mg Intramuscular Given 02/26/18 1135)  ondansetron (ZOFRAN-ODT) disintegrating tablet 8 mg (8 mg Oral Given 02/26/18 1501)     Initial Impression / Assessment and Plan / ED Course  I have reviewed the triage vital signs and the nursing notes.  Pertinent labs & imaging results that were available during my care of the patient were reviewed by me and considered in my medical decision making (see chart for details).  33 year old male presents with intermittent LUQ pain for the past 2 weeks. He is hypertensive but otherwise vitals are normal. Heart is regular rate and rhythm. Lungs are CTA. There is no abdominal or CVA tenderness on exam but he points to his LUQ where he is having pain. He thinks it's kidney stones. Will obtain labs and UA. Will forego imaging at this time.  CBC is normal. CMP is remarkable for hyperglycemia (203) and elevated protein (8.4), and bilirubin (3.7). Lipase is  102. Most likely mild pancreatitis. Will order RUQ [place]. UA sows small bilirubin, 250 glucose, and 15 ketones.  US shows polyp and fatty liver. Discussed with patient. Advised clear liquid diet for the next several days. He was given rx for pain medicine and zofran. He was encouraged to establish care with a primary doctor. He was advised to return if worsening   Final Clinical Impressions(s) / ED Diagnoses   Final diagnoses:  Pancreatitis  Elevated bilirubin  Fatty liver    ED Discharge Orders    None       Bethel Born, PA-C 02/26/18 1548    Vanetta Mulders, MD 03/02/18 [id number]

## 2018-02-26 NOTE — ED Notes (Signed)
In [place] 

## 2018-02-26 NOTE — Discharge Instructions (Signed)
Take pain medicine for severe pain. Do not take while driving or at work Take Zofran for nausea Follow a clear liquid diet for the next 3 days and then you can try solid foods Follow up with a primary doctor Return if you are worsening

## 2018-02-26 NOTE — ED Triage Notes (Signed)
Reports history of kidney stones, currently c/o left flank pain and nausea.  Denies dysuria, hematuria, vomiting.

## 2018-05-26 ENCOUNTER — Encounter (HOSPITAL_BASED_OUTPATIENT_CLINIC_OR_DEPARTMENT_OTHER): Payer: Self-pay | Admitting: Emergency Medicine

## 2018-05-26 ENCOUNTER — Emergency Department (HOSPITAL_BASED_OUTPATIENT_CLINIC_OR_DEPARTMENT_OTHER)
Admission: EM | Admit: 2018-05-26 | Discharge: 2018-05-27 | Disposition: A | Discharge status: Home / Self Care | Payer: Self-pay | Attending: Emergency Medicine | Admitting: Emergency Medicine

## 2018-05-26 ENCOUNTER — Other Ambulatory Visit: Payer: Self-pay

## 2018-05-26 DIAGNOSIS — R1012 Left upper quadrant pain: Secondary | ICD-10-CM | POA: Insufficient documentation

## 2018-05-26 DIAGNOSIS — E119 Type 2 diabetes mellitus without complications: Secondary | ICD-10-CM | POA: Insufficient documentation

## 2018-05-26 HISTORY — DX: Type 2 diabetes mellitus without complications: E11.9

## 2018-05-26 HISTORY — DX: Acute pancreatitis without necrosis or infection, unspecified: K85.90

## 2018-05-26 LAB — CBC WITH DIFFERENTIAL/PLATELET
Abs Immature Granulocytes: 0.01 10*3/uL (ref 0.00–0.07)
Basophils Absolute: 0 10*3/uL (ref 0.0–0.1)
Basophils Relative: 0 %
Eosinophils Absolute: 0 10*3/uL (ref 0.0–0.5)
Eosinophils Relative: 0 %
HCT: 46.2 % (ref 39.0–52.0)
Hemoglobin: 15.5 g/dL (ref 13.0–17.0)
Immature Granulocytes: 0 %
Lymphocytes Relative: 50 %
Lymphs Abs: 2.9 10*3/uL (ref 0.7–4.0)
MCH: 30.6 pg (ref 26.0–34.0)
MCHC: 33.5 g/dL (ref 30.0–36.0)
MCV: 91.3 fL (ref 80.0–100.0)
Monocytes Absolute: 0.4 10*3/uL (ref 0.1–1.0)
Monocytes Relative: 7 %
Neutro Abs: 2.5 10*3/uL (ref 1.7–7.7)
Neutrophils Relative %: 43 %
Platelets: 280 10*3/uL (ref 150–400)
RBC: 5.06 MIL/uL (ref 4.22–5.81)
RDW: 12.3 % (ref 11.5–15.5)
WBC: 5.8 10*3/uL (ref 4.0–10.5)
nRBC: 0 % (ref 0.0–0.2)

## 2018-05-26 MED ORDER — SODIUM CHLORIDE 0.9 % IV BOLUS
1000.0000 mL | Freq: Once | INTRAVENOUS | Status: AC
  Administered 2018-05-26: 1000 mL via INTRAVENOUS

## 2018-05-26 NOTE — ED Triage Notes (Addendum)
Pt reports abd pain that started yesterday. Pt states pain similar to previous pancreatitis. Pt states he was nauseated at home. He took zofran with relief of nausea. Pt is diabetic and is not currently taking any medications for DM.

## 2018-05-26 NOTE — ED Notes (Signed)
IV attempted by Windy Kalata RN unsuccessful in left [facility].

## 2018-05-26 NOTE — ED Provider Notes (Signed)
[facility] EMERGENCY DEPARTMENT Provider Note   CSN: [id number] Arrival date & time: 05/26/18  2333    History   Chief Complaint Chief Complaint  Patient presents with  . Abdominal Pain    HPI Patrick Donovan is a 33 y.o. male.     Patient presents to the emergency department with complaints of left upper abdominal pain.  Patient reports that symptoms began earlier today.  He has had nausea but no vomiting.  He took Zofran earlier and the nausea improved.  Patient reports that he was seen here previously with similar symptoms and was told he had pancreatitis.  He does not drink alcohol.     Past Medical History:  Diagnosis Date  . Diabetes mellitus without complication (HCC)   . MRSA infection   . Pancreatitis     There are no active problems to display for this patient.   History reviewed. No pertinent surgical history.      Home Medications    Prior to Admission medications   Medication Sig Start Date End Date Taking? Authorizing Provider  ondansetron (ZOFRAN) 4 MG tablet Take 1 tablet (4 mg total) by mouth every 6 (six) hours. 05/27/18   Gilda Crease, MD  ranitidine (ZANTAC) 150 MG capsule Take 1 capsule (150 mg total) by mouth daily. 05/27/18   Gilda Crease, MD  sucralfate (CARAFATE) 1 g tablet Take 1 tablet (1 g total) by mouth 4 (four) times daily -  with meals and at bedtime. 05/27/18   Gilda Crease, MD    Family History No family history on file.  Social History Social History   Tobacco Use  . Smoking status: Never Smoker  . Smokeless tobacco: Never Used  Substance Use Topics  . Alcohol use: No  . Drug use: No     Allergies   Patient has no known allergies.   Review of Systems Review of Systems  Gastrointestinal: Positive for abdominal pain and nausea.  All other systems reviewed and are negative.    Physical Exam Updated Vital Signs BP 134/84   Pulse 83   Temp 97.9 F (36.6 C) (Oral)   Resp  16   Ht  (1.651 m)   Wt 68 kg   SpO2 100%   BMI 24.96 kg/m   Physical Exam Vitals signs and nursing note reviewed.  Constitutional:      General: He is not in acute distress.    Appearance: Normal appearance. He is well-developed.  HENT:     Head: Normocephalic and atraumatic.     Right Ear: Hearing normal.     Left Ear: Hearing normal.     Nose: Nose normal.  Eyes:     Conjunctiva/sclera: Conjunctivae normal.     Pupils: Pupils are equal, round, and reactive to light.  Neck:     Musculoskeletal: Normal range of motion and neck supple.  Cardiovascular:     Rate and Rhythm: Regular rhythm.     Heart sounds: S1 normal and S2 normal. No murmur. No friction rub. No gallop.   Pulmonary:     Effort: Pulmonary effort is normal. No respiratory distress.     Breath sounds: Normal breath sounds.  Chest:     Chest wall: No tenderness.  Abdominal:     General: Bowel sounds are normal.     Palpations: Abdomen is soft.     Tenderness: There is abdominal tenderness in the left upper quadrant. There is no guarding or rebound. Negative signs  include Murphy's sign and McBurney's sign.     Hernia: No hernia is present.  Musculoskeletal: Normal range of motion.  Skin:    General: Skin is warm and dry.     Findings: No rash.  Neurological:     Mental Status: He is alert and oriented to person, place, and time.     GCS: GCS eye subscore is 4. GCS verbal subscore is 5. GCS motor subscore is 6.     Cranial Nerves: No cranial nerve deficit.     Sensory: No sensory deficit.     Coordination: Coordination normal.  Psychiatric:        Speech: Speech normal.        Behavior: Behavior normal.        Thought Content: Thought content normal.      ED Treatments / Results  Labs (all labs ordered are listed, but only abnormal results are displayed) Labs Reviewed  COMPREHENSIVE METABOLIC PANEL - Abnormal; Notable for the following components:      Result Value   Sodium 134 (*)     Glucose, Bld 144 (*)    Total Protein 8.4 (*)    Albumin 5.1 (*)    Total Bilirubin 2.2 (*)    All other components within normal limits  CBC WITH DIFFERENTIAL/PLATELET  LIPASE, BLOOD    EKG None  Radiology Ct Abdomen Pelvis W Contrast  Result Date: 05/27/2018 CLINICAL DATA:  33 year old male with abdominal pain. EXAM: CT ABDOMEN AND PELVIS WITH CONTRAST TECHNIQUE: Multidetector CT imaging of the abdomen and pelvis was performed using the standard protocol following bolus administration of intravenous contrast. CONTRAST:  OMNIPAQUE IOHEXOL 300 MG/ML  SOLN COMPARISON:  Abdominal ultrasound dated 02/26/2018 FINDINGS: Lower chest: The visualized lung bases are clear. No intra-abdominal free air or free fluid. Hepatobiliary: Probable mild fatty infiltration of the liver. No intrahepatic biliary ductal dilatation. The gallbladder is unremarkable. Pancreas: Unremarkable. No pancreatic ductal dilatation or surrounding inflammatory changes. Spleen: Normal in size without focal abnormality. Adrenals/Urinary Tract: The adrenal glands are unremarkable. There is no hydronephrosis on either side. The visualized ureters and urinary bladder appear unremarkable. Stomach/Bowel: There is no bowel obstruction or active inflammation. The appendix is normal. Vascular/Lymphatic: No significant vascular findings are present. No enlarged abdominal or pelvic lymph nodes. Reproductive: Uterus and bilateral adnexa are unremarkable. Other: None Musculoskeletal: No acute or significant osseous findings. IMPRESSION: No acute intra-abdominal or pelvic pathology. No bowel obstruction or active inflammation. Normal appendix. Electronically Signed   By: Elgie Collard M.D.   On: 05/27/2018 01:29    Procedures Procedures (including critical care time)  Medications Ordered in ED Medications  sodium chloride 0.9 % bolus 1,000 mL (1,000 mLs Intravenous New Bag/Given 05/26/18 2352)  iohexol (OMNIPAQUE) 300 MG/ML solution  100 mL (100 mLs Intravenous Contrast Given 05/27/18 0107)     Initial Impression / Assessment and Plan / ED Course  I have reviewed the triage vital signs and the nursing notes.  Pertinent labs & imaging results that were available during my care of the patient were reviewed by me and considered in my medical decision making (see chart for details).        Patient presents to the emergency department for evaluation of abdominal pain.  Patient complaining of pain in the left upper abdomen.  He has been seen previously.  During that visit he had a slightly elevated lipase and was told that he had pancreatitis.  Ultrasound performed at that visit did not show any  acute pathology.  Patient's lab work is reassuringly normal today.  This includes a normal lipase.  He did have acute nausea and vomiting with his previous visit, nauseated today without vomiting.  He underwent CT scan that did not show any acute pathology.  Patient informed that I cannot determine the cause of his symptoms today but he does not have an acute surgical process.  Will treat symptomatically and refer to GI.  Final Clinical Impressions(s) / ED Diagnoses   Final diagnoses:  Left upper quadrant pain    ED Discharge Orders         Ordered    ranitidine (ZANTAC) 150 MG capsule  Daily     05/27/18 0147    sucralfate (CARAFATE) 1 g tablet  3 times daily with meals & bedtime     05/27/18 0147    ondansetron (ZOFRAN) 4 MG tablet  Every 6 hours     05/27/18 0147           Gilda CreasePollina, Marifer Hurd J, MD 05/27/18 0147

## 2018-05-27 ENCOUNTER — Emergency Department (HOSPITAL_BASED_OUTPATIENT_CLINIC_OR_DEPARTMENT_OTHER): Payer: Self-pay

## 2018-05-27 LAB — COMPREHENSIVE METABOLIC PANEL
ALT: 25 U/L (ref 0–44)
AST: 24 U/L (ref 15–41)
Albumin: 5.1 g/dL — ABNORMAL HIGH (ref 3.5–5.0)
Alkaline Phosphatase: 73 U/L (ref 38–126)
Anion gap: 13 (ref 5–15)
BUN: 10 mg/dL (ref 6–20)
CO2: 22 mmol/L (ref 22–32)
Calcium: 9.6 mg/dL (ref 8.9–10.3)
Chloride: 99 mmol/L (ref 98–111)
Creatinine, Ser: 0.93 mg/dL (ref 0.61–1.24)
GFR calc Af Amer: >60 mL/min (ref >60)
GFR calc non Af Amer: >60 mL/min (ref >60)
Glucose, Bld: 144 mg/dL — ABNORMAL HIGH (ref 70–99)
Potassium: 3.7 mmol/L (ref 3.5–5.1)
Sodium: 134 mmol/L — ABNORMAL LOW (ref 135–145)
Total Bilirubin: 2.2 mg/dL — ABNORMAL HIGH (ref 0.3–1.2)
Total Protein: 8.4 g/dL — ABNORMAL HIGH (ref 6.5–8.1)

## 2018-05-27 LAB — LIPASE, BLOOD: Lipase: 42 U/L (ref 11–51)

## 2018-05-27 MED ORDER — ONDANSETRON HCL 4 MG PO TABS: 4.0000 mg | ORAL_TABLET | Freq: Four times a day (QID) | ORAL | 0 refills | Status: AC

## 2018-05-27 MED ORDER — SUCRALFATE 1 G PO TABS: 1.0000 g | ORAL_TABLET | Freq: Three times a day (TID) | ORAL | 0 refills | Status: AC

## 2018-05-27 MED ORDER — RANITIDINE HCL 150 MG PO CAPS: 150.0000 mg | ORAL_CAPSULE | Freq: Every day | ORAL | 0 refills | Status: AC

## 2018-05-27 MED ORDER — IOHEXOL 300 MG/ML  SOLN
100.0000 mL | Freq: Once | INTRAMUSCULAR | Status: AC | PRN
  Administered 2018-05-27: 100 mL via INTRAVENOUS

## 2020-07-23 IMAGING — CT CT ABDOMEN AND PELVIS WITH CONTRAST
2 of 4 series · 16 of 46 positions shown, 18 images · IV contrast (omnipaque)
Comparison: Abdominal ultrasound dated 02/26/2018

CLINICAL DATA: 33-year-old male with abdominal pain.

EXAM:
CT ABDOMEN AND PELVIS WITH CONTRAST
TECHNIQUE: Multidetector CT imaging of the abdomen and pelvis was performed
using the standard protocol following bolus administration of
intravenous contrast.
CONTRAST:  100mL OMNIPAQUE IOHEXOL 300 MG/ML  SOLN

[Series 2: axial st · axial · 0.72mm/px · z∈[-546,-91]mm · 13 of 101 slices shown, 15 images]
[im 5/101  soft-tissue]
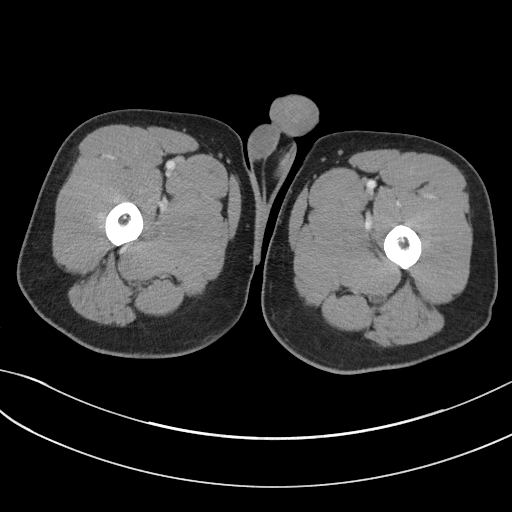
[im 5/101  bone]
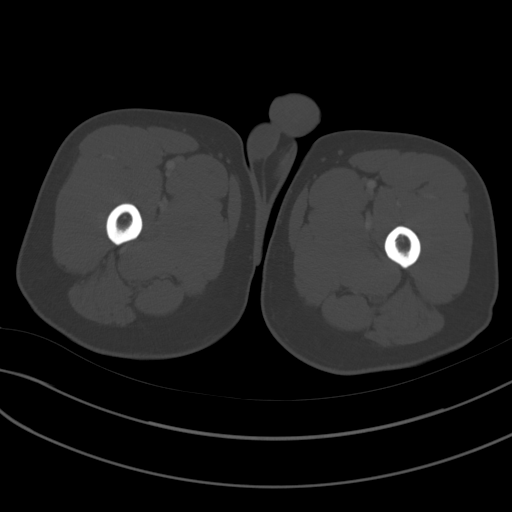
[im 13/101  soft-tissue]
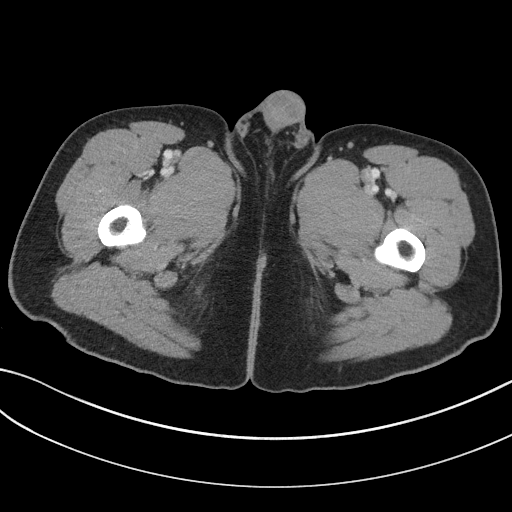
[im 21/101  soft-tissue]
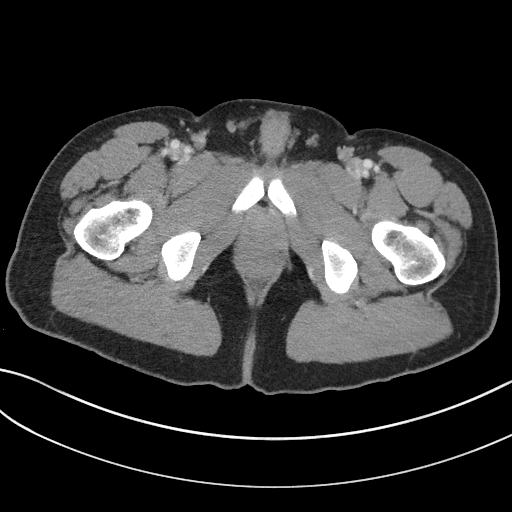
[im 30/101  soft-tissue]
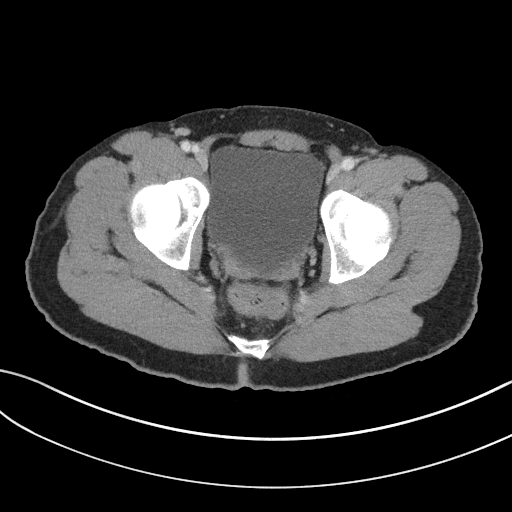
[im 34/101  soft-tissue]
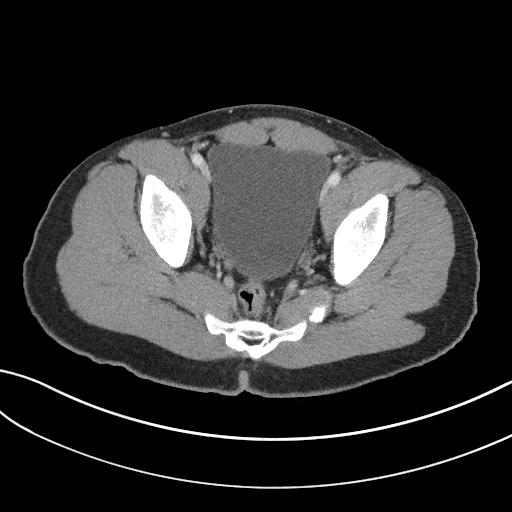
[im 42/101  soft-tissue]
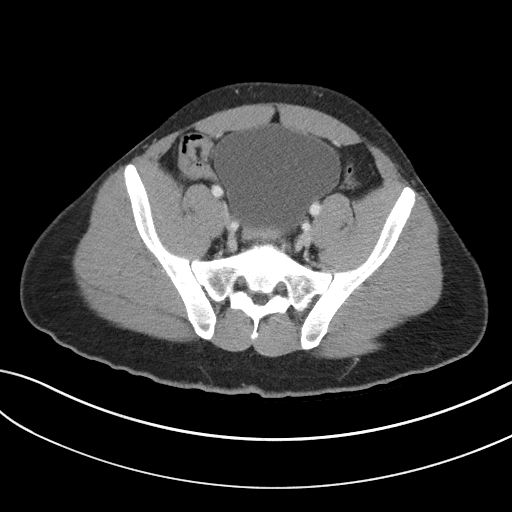
[im 51/101  soft-tissue]
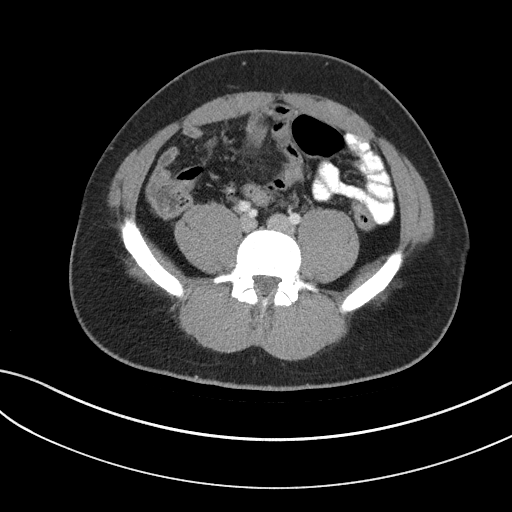
[im 59/101  soft-tissue]
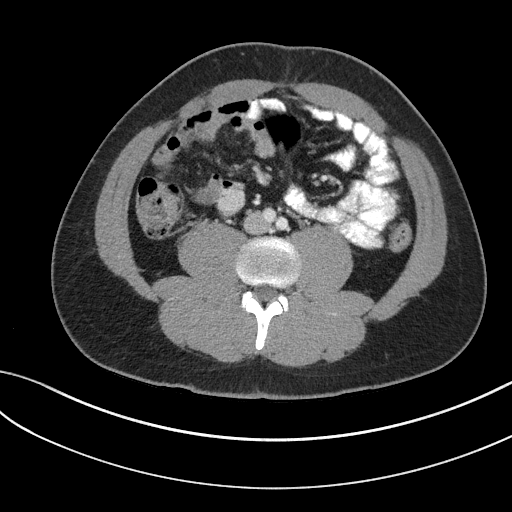
[im 67/101  soft-tissue]
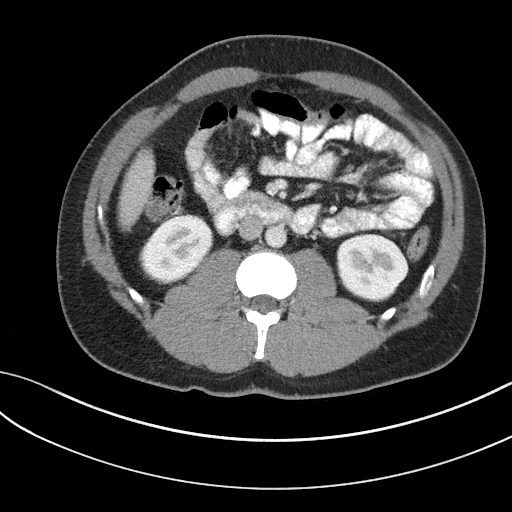
[im 67/101  bone]
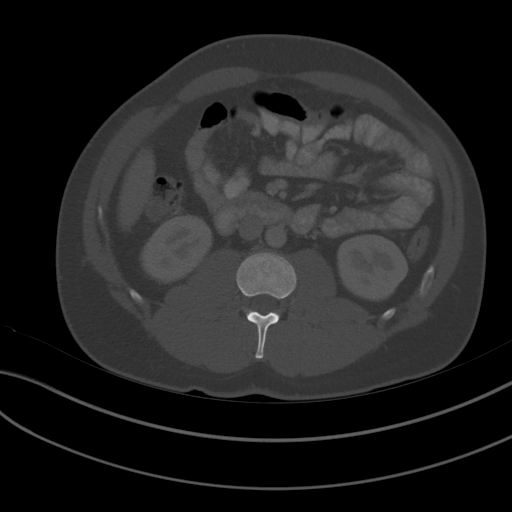
[im 71/101  soft-tissue]
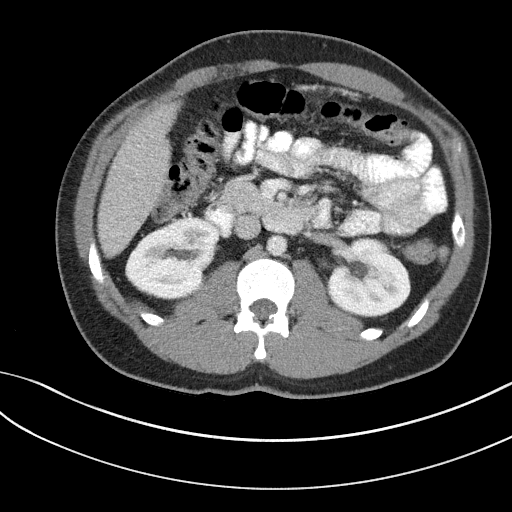
[im 80/101  soft-tissue]
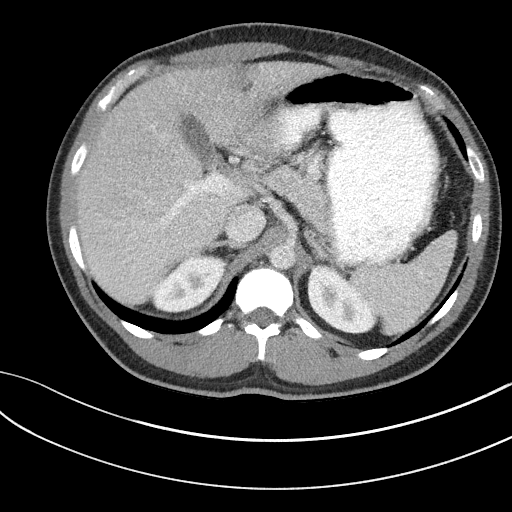
[im 88/101  soft-tissue]
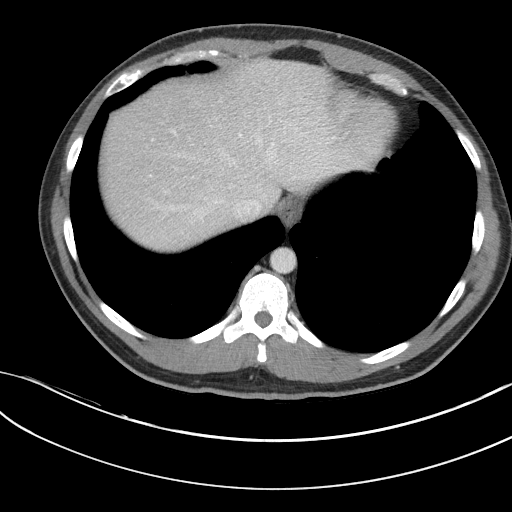
[im 96/101  soft-tissue]
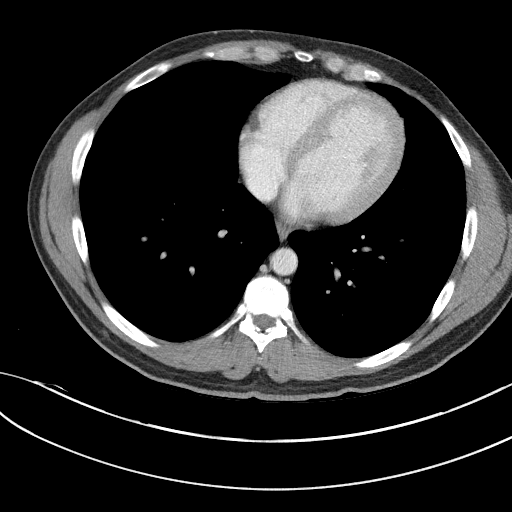

[Series 5: coronal st · coronal · 0.77mm/px · 3 of 92 slices shown]
[im 31/92  soft-tissue]
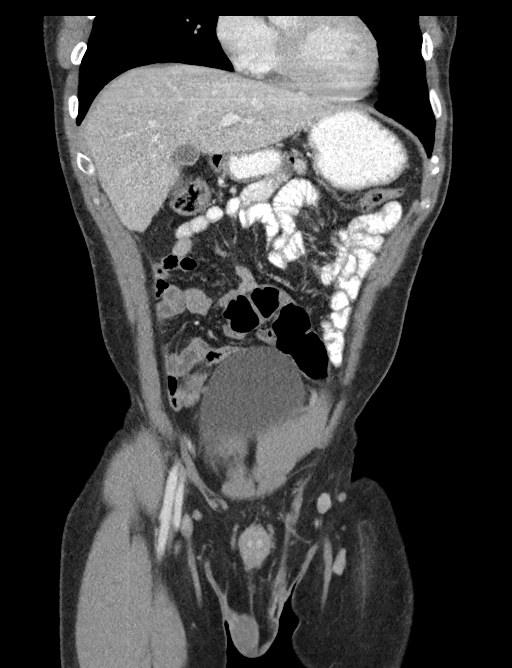
[im 41/92  soft-tissue]
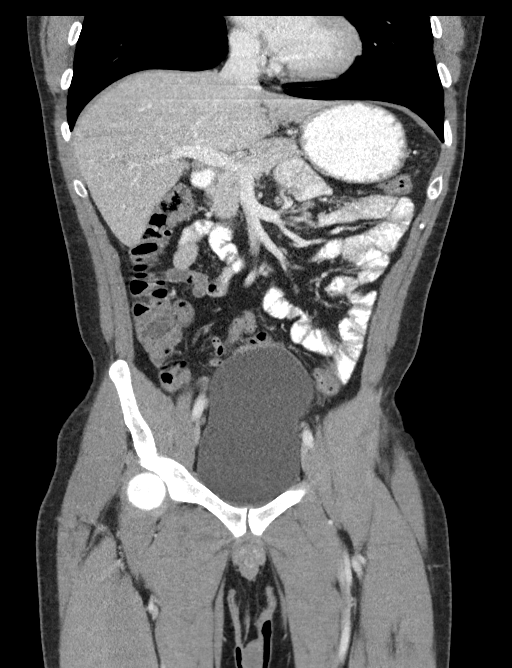
[im 51/92  soft-tissue]
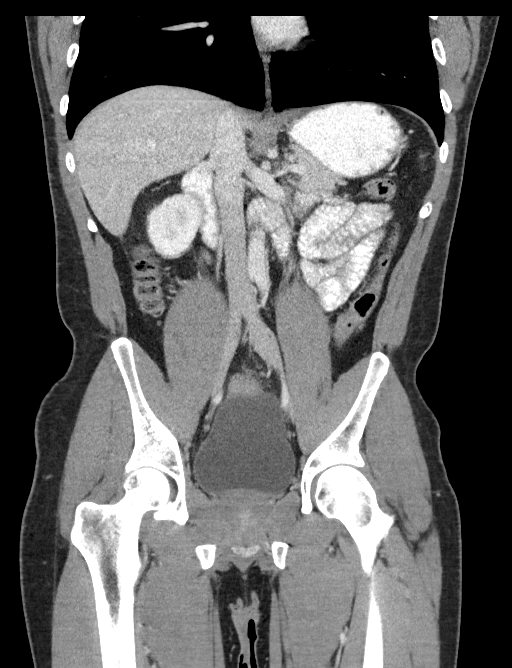

[16 of 46 positions shown; findings below may reference images not displayed]

FINDINGS: Lower chest: The visualized lung bases are clear.

No intra-abdominal free air or free fluid.

Hepatobiliary: Probable mild fatty infiltration of the liver. No
intrahepatic biliary ductal dilatation. The gallbladder is
unremarkable.

Pancreas: Unremarkable. No pancreatic ductal dilatation or
surrounding inflammatory changes.

Spleen: Normal in size without focal abnormality.

Adrenals/Urinary Tract: The adrenal glands are unremarkable. There
is no hydronephrosis on either side. The visualized ureters and
urinary bladder appear unremarkable.

Stomach/Bowel: There is no bowel obstruction or active inflammation.
The appendix is normal.

Vascular/Lymphatic: No significant vascular findings are present. No
enlarged abdominal or pelvic lymph nodes.

Reproductive: Uterus and bilateral adnexa are unremarkable.

Other: None

Musculoskeletal: No acute or significant osseous findings.
IMPRESSION: No acute intra-abdominal or pelvic pathology. No bowel obstruction
or active inflammation. Normal appendix.
